# Patient Record
Sex: Male | Born: 1955 | Race: Black or African American | Hispanic: No | Marital: Married | Smoking: Never smoker
Health system: Southern US, Community
[De-identification: ages and names within clinical notes are randomized; demographics above are authoritative.]

## PROBLEM LIST (undated history)

## (undated) ENCOUNTER — Ambulatory Visit: Admission: EM | Payer: 59 | Source: Home / Self Care

## (undated) DIAGNOSIS — C801 Malignant (primary) neoplasm, unspecified: Secondary | ICD-10-CM

## (undated) DIAGNOSIS — I10 Essential (primary) hypertension: Secondary | ICD-10-CM

## (undated) DIAGNOSIS — M1990 Unspecified osteoarthritis, unspecified site: Secondary | ICD-10-CM

## (undated) DIAGNOSIS — C61 Malignant neoplasm of prostate: Secondary | ICD-10-CM

## (undated) DIAGNOSIS — N529 Male erectile dysfunction, unspecified: Secondary | ICD-10-CM

## (undated) DIAGNOSIS — M109 Gout, unspecified: Secondary | ICD-10-CM

## (undated) HISTORY — DX: Male erectile dysfunction, unspecified: N52.9

## (undated) HISTORY — DX: Unspecified osteoarthritis, unspecified site: M19.90

## (undated) HISTORY — DX: Essential (primary) hypertension: I10

---

## 1998-04-23 ENCOUNTER — Emergency Department (HOSPITAL_COMMUNITY): Admission: EM | Admit: 1998-04-23 | Discharge: 1998-04-23 | Payer: Self-pay | Admitting: Emergency Medicine

## 2003-08-01 ENCOUNTER — Emergency Department (HOSPITAL_COMMUNITY): Admission: AD | Admit: 2003-08-01 | Discharge: 2003-08-01 | Payer: Self-pay | Admitting: Family Medicine

## 2006-11-16 ENCOUNTER — Emergency Department (HOSPITAL_COMMUNITY): Admission: EM | Admit: 2006-11-16 | Discharge: 2006-11-16 | Payer: Self-pay | Admitting: Emergency Medicine

## 2011-08-07 ENCOUNTER — Ambulatory Visit (INDEPENDENT_AMBULATORY_CARE_PROVIDER_SITE_OTHER): Payer: 59 | Admitting: Internal Medicine

## 2011-08-07 DIAGNOSIS — Z09 Encounter for follow-up examination after completed treatment for conditions other than malignant neoplasm: Secondary | ICD-10-CM

## 2011-08-07 DIAGNOSIS — N529 Male erectile dysfunction, unspecified: Secondary | ICD-10-CM | POA: Insufficient documentation

## 2011-08-07 DIAGNOSIS — I10 Essential (primary) hypertension: Secondary | ICD-10-CM

## 2011-08-07 DIAGNOSIS — M109 Gout, unspecified: Secondary | ICD-10-CM | POA: Insufficient documentation

## 2011-08-07 DIAGNOSIS — Z7189 Other specified counseling: Secondary | ICD-10-CM

## 2011-08-07 LAB — POCT CBC
Granulocyte percent: 58.5 %G (ref 37–80)
HCT, POC: 44.3 % (ref 43.5–53.7)
Hemoglobin: 14.3 g/dL (ref 14.1–18.1)
Lymph, poc: 2.4 (ref 0.6–3.4)
MCH, POC: 27.8 pg (ref 27–31.2)
MCHC: 32.3 g/dL (ref 31.8–35.4)
MCV: 86 fL (ref 80–97)
MID (cbc): 0.4 (ref 0–0.9)
MPV: 10.1 fL (ref 0–99.8)
POC Granulocyte: 3.9 (ref 2–6.9)
POC LYMPH PERCENT: 35.1 %L (ref 10–50)
POC MID %: 6.4 %M (ref 0–12)
Platelet Count, POC: 239 10*3/uL (ref 142–424)
RBC: 5.15 M/uL (ref 4.69–6.13)
RDW, POC: 14.1 %
WBC: 6.7 10*3/uL (ref 4.6–10.2)

## 2011-08-07 LAB — POCT URINALYSIS DIPSTICK
Bilirubin, UA: NEGATIVE
Glucose, UA: NEGATIVE
Ketones, UA: NEGATIVE
Leukocytes, UA: NEGATIVE
Nitrite, UA: NEGATIVE
Protein, UA: 30
Spec Grav, UA: 1.015
Urobilinogen, UA: NEGATIVE
pH, UA: 5.5

## 2011-08-07 LAB — LIPID PANEL
Cholesterol: 191 mg/dL (ref 0–200)
HDL: 46 mg/dL (ref >39)
LDL Cholesterol: 132 mg/dL — ABNORMAL HIGH (ref 0–99)
Total CHOL/HDL Ratio: 4.2 Ratio
Triglycerides: 63 mg/dL (ref <150)
VLDL: 13 mg/dL (ref 0–40)

## 2011-08-07 LAB — COMPREHENSIVE METABOLIC PANEL
ALT: 21 U/L (ref 0–53)
AST: 30 U/L (ref 0–37)
Albumin: 4.6 g/dL (ref 3.5–5.2)
Alkaline Phosphatase: 47 U/L (ref 39–117)
BUN: 11 mg/dL (ref 6–23)
CO2: 26 mEq/L (ref 19–32)
Calcium: 9.5 mg/dL (ref 8.4–10.5)
Chloride: 105 mEq/L (ref 96–112)
Creat: 0.97 mg/dL (ref 0.50–1.35)
Glucose, Bld: 105 mg/dL — ABNORMAL HIGH (ref 70–99)
Potassium: 4.3 mEq/L (ref 3.5–5.3)
Sodium: 140 mEq/L (ref 135–145)
Total Bilirubin: 0.5 mg/dL (ref 0.3–1.2)
Total Protein: 7.2 g/dL (ref 6.0–8.3)

## 2011-08-07 LAB — POCT UA - MICROSCOPIC ONLY
Bacteria, U Microscopic: NEGATIVE
Casts, Ur, LPF, POC: NEGATIVE
Crystals, Ur, HPF, POC: NEGATIVE
Epithelial cells, urine per micros: NEGATIVE
Mucus, UA: NEGATIVE
WBC, Ur, HPF, POC: NEGATIVE
Yeast, UA: NEGATIVE

## 2011-08-07 LAB — URIC ACID: Uric Acid, Serum: 9.1 mg/dL — ABNORMAL HIGH (ref 4.0–7.8)

## 2011-08-07 LAB — TSH: TSH: 0.718 u[IU]/mL (ref 0.350–4.500)

## 2011-08-07 LAB — PSA: PSA: 2.01 ng/mL (ref <=4.00)

## 2011-08-07 LAB — POCT SEDIMENTATION RATE: POCT SED RATE: 12 mm/hr (ref 0–22)

## 2011-08-07 MED ORDER — AMLODIPINE BESY-BENAZEPRIL HCL 5-10 MG PO CAPS: 1.0000 | ORAL_CAPSULE | Freq: Every day | ORAL | Status: DC

## 2011-08-07 MED ORDER — COLCHICINE 0.6 MG PO TABS: 0.6000 mg | ORAL_TABLET | Freq: Two times a day (BID) | ORAL | Status: DC

## 2011-08-07 MED ORDER — TADALAFIL 20 MG PO TABS: 20.0000 mg | ORAL_TABLET | ORAL | Status: DC | PRN

## 2011-08-07 MED ORDER — ALLOPURINOL 300 MG PO TABS: 300.0000 mg | ORAL_TABLET | Freq: Every day | ORAL | Status: DC

## 2011-08-07 NOTE — Patient Instructions (Signed)
Gout Gout is an inflammatory condition (arthritis) caused by a buildup of uric acid crystals in the joints. Uric acid is a chemical that is normally present in the blood. Under some circumstances, uric acid can form into crystals in your joints. This causes joint redness, soreness, and swelling (inflammation). Repeat attacks are common. Over time, uric acid crystals can form into masses (tophi) near a joint, causing disfigurement. Gout is treatable and often preventable. CAUSES  The disease begins with elevated levels of uric acid in the blood. Uric acid is produced by your body when it breaks down a naturally found substance called purines. This also happens when you eat certain foods such as meats and fish. Causes of an elevated uric acid level include:  Being passed down from parent to child (heredity).   Diseases that cause increased uric acid production (obesity, psoriasis, some cancers).   Excessive alcohol use.   Diet, especially diets rich in meat and seafood.   Medicines, including certain cancer-fighting drugs (chemotherapy), diuretics, and aspirin.   Chronic kidney disease. The kidneys are no longer able to remove uric acid well.   Problems with metabolism.  Conditions strongly associated with gout include:  Obesity.   High blood pressure.   High cholesterol.   Diabetes.  Not everyone with elevated uric acid levels gets gout. It is not understood why some people get gout and others do not. Surgery, joint injury, and eating too much of certain foods are some of the factors that can lead to gout. SYMPTOMS   An attack of gout comes on quickly. It causes intense pain with redness, swelling, and warmth in a joint.   Fever can occur.   Often, only one joint is involved. Certain joints are more commonly involved:   Base of the big toe.   Knee.   Ankle.   Wrist.   Finger.  Without treatment, an attack usually goes away in a few days to weeks. Between attacks, you  usually will not have symptoms, which is different from many other forms of arthritis. DIAGNOSIS  Your caregiver will suspect gout based on your symptoms and exam. Removal of fluid from the joint (arthrocentesis) is done to check for uric acid crystals. Your caregiver will give you a medicine that numbs the area (local anesthetic) and use a needle to remove joint fluid for exam. Gout is confirmed when uric acid crystals are seen in joint fluid, using a special microscope. Sometimes, blood, urine, and X-ray tests are also used. TREATMENT  There are 2 phases to gout treatment: treating the sudden onset (acute) attack and preventing attacks (prophylaxis). Treatment of an Acute Attack  Medicines are used. These include anti-inflammatory medicines or steroid medicines.   An injection of steroid medicine into the affected joint is sometimes necessary.   The painful joint is rested. Movement can worsen the arthritis.   You may use warm or cold treatments on painful joints, depending which works best for you.   Discuss the use of coffee, vitamin C, or cherries with your caregiver. These may be helpful treatment options.  Treatment to Prevent Attacks After the acute attack subsides, your caregiver may advise prophylactic medicine. These medicines either help your kidneys eliminate uric acid from your body or decrease your uric acid production. You may need to stay on these medicines for a very long time. The early phase of treatment with prophylactic medicine can be associated with an increase in acute gout attacks. For this reason, during the first few months   of treatment, your caregiver may also advise you to take medicines usually used for acute gout treatment. Be sure you understand your caregiver's directions. You should also discuss dietary treatment with your caregiver. Certain foods such as meats and fish can increase uric acid levels. Other foods such as dairy can decrease levels. Your caregiver  can give you a list of foods to avoid. HOME CARE INSTRUCTIONS   Do not take aspirin to relieve pain. This raises uric acid levels.   Only take over-the-counter or prescription medicines for pain, discomfort, or fever as directed by your caregiver.   Rest the joint as much as possible. When in bed, keep sheets and blankets off painful areas.   Keep the affected joint raised (elevated).   Use crutches if the painful joint is in your leg.   Drink enough water and fluids to keep your urine clear or pale yellow. This helps your body get rid of uric acid. Do not drink alcoholic beverages. They slow the passage of uric acid.   Follow your caregiver's dietary instructions. Pay careful attention to the amount of protein you eat. Your daily diet should emphasize fruits, vegetables, whole grains, and fat-free or low-fat milk products.   Maintain a healthy body weight.  SEEK MEDICAL CARE IF:   You have an oral temperature above 102 F (38.9 C).   You develop diarrhea, vomiting, or any side effects from medicines.   You do not feel better in 24 hours, or you are getting worse.  SEEK IMMEDIATE MEDICAL CARE IF:   Your joint becomes suddenly more tender and you have:   Chills.   An oral temperature above 102 F (38.9 C), not controlled by medicine.  MAKE SURE YOU:   Understand these instructions.   Will watch your condition.   Will get help right away if you are not doing well or get worse.  Document Released: 05/07/2000 Document Revised: 04/29/2011 Document Reviewed: 08/18/2009 [employer] Patient Information 2012 [employer]. 

## 2011-08-07 NOTE — Progress Notes (Signed)
  Subjective:    Patient ID: Thomas Ayala, male    DOB: Nov 01, 1955, 56 y.o.   MRN: [id number]  HPI Has gout,HTN,ED Feels good. Needs meds refilled See complete hx scanned in.    Review of Systems See ROS scanned in.     Objective:   Physical Exam Normal        Assessment & Plan:  Refill meds Schedule CPE soon, agrees

## 2011-08-09 ENCOUNTER — Encounter: Payer: Self-pay | Admitting: *Deleted

## 2011-09-16 ENCOUNTER — Telehealth: Payer: Self-pay

## 2011-09-16 DIAGNOSIS — Z7189 Other specified counseling: Secondary | ICD-10-CM

## 2011-09-16 DIAGNOSIS — N529 Male erectile dysfunction, unspecified: Secondary | ICD-10-CM

## 2011-09-16 DIAGNOSIS — I10 Essential (primary) hypertension: Secondary | ICD-10-CM

## 2011-09-16 MED ORDER — COLCHICINE 0.6 MG PO TABS: 0.6000 mg | ORAL_TABLET | Freq: Two times a day (BID) | ORAL | Status: DC

## 2011-09-16 NOTE — Telephone Encounter (Signed)
PT STATES HE HAS GOUT AND WOULD LIKE A PRESCRIPTION - PHARMACY - [facility] ON [address]  STATES HE HAS NO MONEY FOR AN OV  PLEASE CALL

## 2011-09-16 NOTE — Telephone Encounter (Signed)
Left message on machine that rx was sent to pharm.

## 2011-09-16 NOTE — Telephone Encounter (Signed)
Can we give him something for gout?  He was seen on 08/07/11.

## 2012-01-19 ENCOUNTER — Ambulatory Visit: Payer: Self-pay | Admitting: Internal Medicine

## 2012-08-28 DIAGNOSIS — R809 Proteinuria, unspecified: Secondary | ICD-10-CM | POA: Insufficient documentation

## 2012-08-28 DIAGNOSIS — E291 Testicular hypofunction: Secondary | ICD-10-CM | POA: Insufficient documentation

## 2012-09-09 ENCOUNTER — Other Ambulatory Visit: Payer: Self-pay | Admitting: Internal Medicine

## 2014-06-20 DIAGNOSIS — Z8546 Personal history of malignant neoplasm of prostate: Secondary | ICD-10-CM | POA: Insufficient documentation

## 2014-06-20 DIAGNOSIS — C61 Malignant neoplasm of prostate: Secondary | ICD-10-CM | POA: Insufficient documentation

## 2015-12-04 DIAGNOSIS — N5201 Erectile dysfunction due to arterial insufficiency: Secondary | ICD-10-CM | POA: Insufficient documentation

## 2016-05-21 ENCOUNTER — Inpatient Hospital Stay (HOSPITAL_COMMUNITY)
Admission: EM | Admit: 2016-05-21 | Discharge: 2016-05-24 | DRG: 872 | Disposition: A | Discharge status: Home / Self Care | Payer: Commercial Managed Care - HMO | Attending: Internal Medicine | Admitting: Internal Medicine

## 2016-05-21 ENCOUNTER — Emergency Department (HOSPITAL_COMMUNITY): Payer: Commercial Managed Care - HMO

## 2016-05-21 ENCOUNTER — Encounter (HOSPITAL_COMMUNITY): Payer: Self-pay

## 2016-05-21 DIAGNOSIS — Z8546 Personal history of malignant neoplasm of prostate: Secondary | ICD-10-CM

## 2016-05-21 DIAGNOSIS — Z79899 Other long term (current) drug therapy: Secondary | ICD-10-CM

## 2016-05-21 DIAGNOSIS — I10 Essential (primary) hypertension: Secondary | ICD-10-CM | POA: Diagnosis present

## 2016-05-21 DIAGNOSIS — N12 Tubulo-interstitial nephritis, not specified as acute or chronic: Secondary | ICD-10-CM | POA: Diagnosis present

## 2016-05-21 DIAGNOSIS — N390 Urinary tract infection, site not specified: Secondary | ICD-10-CM | POA: Diagnosis not present

## 2016-05-21 DIAGNOSIS — M109 Gout, unspecified: Secondary | ICD-10-CM | POA: Diagnosis present

## 2016-05-21 DIAGNOSIS — A419 Sepsis, unspecified organism: Principal | ICD-10-CM | POA: Diagnosis present

## 2016-05-21 DIAGNOSIS — M545 Low back pain: Secondary | ICD-10-CM | POA: Diagnosis present

## 2016-05-21 DIAGNOSIS — Z87891 Personal history of nicotine dependence: Secondary | ICD-10-CM

## 2016-05-21 HISTORY — DX: Malignant neoplasm of prostate: C61

## 2016-05-21 LAB — COMPREHENSIVE METABOLIC PANEL
ALT: 24 U/L (ref 17–63)
AST: 23 U/L (ref 15–41)
Albumin: 3.2 g/dL — ABNORMAL LOW (ref 3.5–5.0)
Alkaline Phosphatase: 51 U/L (ref 38–126)
Anion gap: 7 (ref 5–15)
BUN: 9 mg/dL (ref 6–20)
CO2: 27 mmol/L (ref 22–32)
Calcium: 8.5 mg/dL — ABNORMAL LOW (ref 8.9–10.3)
Chloride: 106 mmol/L (ref 101–111)
Creatinine, Ser: 1.21 mg/dL (ref 0.61–1.24)
GFR calc Af Amer: >60 mL/min (ref >60)
GFR calc non Af Amer: >60 mL/min (ref >60)
Glucose, Bld: 116 mg/dL — ABNORMAL HIGH (ref 65–99)
Potassium: 3.6 mmol/L (ref 3.5–5.1)
Sodium: 140 mmol/L (ref 135–145)
Total Bilirubin: 0.6 mg/dL (ref 0.3–1.2)
Total Protein: 6.6 g/dL (ref 6.5–8.1)

## 2016-05-21 LAB — CBC WITH DIFFERENTIAL/PLATELET
Basophils Absolute: 0 10*3/uL (ref 0.0–0.1)
Basophils Relative: 0 %
Eosinophils Absolute: 0.1 10*3/uL (ref 0.0–0.7)
Eosinophils Relative: 1 %
HCT: 36.4 % — ABNORMAL LOW (ref 39.0–52.0)
Hemoglobin: 12.6 g/dL — ABNORMAL LOW (ref 13.0–17.0)
Lymphocytes Relative: 11 %
Lymphs Abs: 1.3 10*3/uL (ref 0.7–4.0)
MCH: 28.6 pg (ref 26.0–34.0)
MCHC: 34.6 g/dL (ref 30.0–36.0)
MCV: 82.7 fL (ref 78.0–100.0)
Monocytes Absolute: 0.6 10*3/uL (ref 0.1–1.0)
Monocytes Relative: 5 %
Neutro Abs: 10.4 10*3/uL — ABNORMAL HIGH (ref 1.7–7.7)
Neutrophils Relative %: 83 %
Platelets: 194 10*3/uL (ref 150–400)
RBC: 4.4 MIL/uL (ref 4.22–5.81)
RDW: 13.1 % (ref 11.5–15.5)
WBC: 12.5 10*3/uL — ABNORMAL HIGH (ref 4.0–10.5)

## 2016-05-21 LAB — URINALYSIS, MICROSCOPIC (REFLEX)

## 2016-05-21 LAB — URINALYSIS, ROUTINE W REFLEX MICROSCOPIC
Bilirubin Urine: NEGATIVE
Glucose, UA: 100 mg/dL — AB
Ketones, ur: NEGATIVE mg/dL
Nitrite: POSITIVE — AB
Protein, ur: >300 mg/dL — AB
Specific Gravity, Urine: 1.02 (ref 1.005–1.030)
pH: 6 (ref 5.0–8.0)

## 2016-05-21 LAB — I-STAT CG4 LACTIC ACID, ED: Lactic Acid, Venous: 2.63 mmol/L (ref 0.5–1.9)

## 2016-05-21 MED ORDER — SODIUM CHLORIDE 0.9 % IV BOLUS (SEPSIS)
1000.0000 mL | Freq: Once | INTRAVENOUS | Status: AC
  Administered 2016-05-21: 1000 mL via INTRAVENOUS

## 2016-05-21 MED ORDER — CEFTRIAXONE SODIUM 1 G IJ SOLR
1.0000 g | Freq: Once | INTRAMUSCULAR | Status: AC
  Administered 2016-05-21: 1 g via INTRAVENOUS
  Filled 2016-05-21: qty 10

## 2016-05-21 MED ORDER — SODIUM CHLORIDE 0.9 % IV BOLUS (SEPSIS)
1000.0000 mL | Freq: Once | INTRAVENOUS | Status: AC
  Administered 2016-05-22: 1000 mL via INTRAVENOUS

## 2016-05-21 MED ORDER — SODIUM CHLORIDE 0.9 % IV SOLN
INTRAVENOUS | Status: DC
  Administered 2016-05-22 – 2016-05-24 (×4): via INTRAVENOUS

## 2016-05-21 NOTE — H&P (Signed)
History and Physical    Thomas Ayala DOB: 04/03/1956 DOA: 05/21/2016  Referring MD/NP/PA:   PCP: Kennon Portela, MD   Patient coming from:  The patient is coming from home.  At baseline, pt is independent for most of ADL.  Chief Complaint: Fever, chills, dysuria  HPI: Thomas Ayala is a 60 y.o. male with medical history significant of hyperlipidemia, gout, erectile dysfunction, arthritis, prostate cancer (under watching, no treatment), who presents with fever, chills, dysuria.  Patient states that she has been having fever, chills in the past 3 days. She also has dysuria, burning on urination and increased urinary frequency. She has mild cough, no chest pain or shortness of breath. Patient had lower abdominal pain earlier, which has resolved. No nausea, vomiting, diarrhea. She complains sulfa lower back pain, but no flank pain. No unilateral weakness.  ED Course: pt was found to have positive urinalysis with small amount of leukocytes and positive nitrite, WBC 12.5, lactate 2.63, creatinine 1.21, negative chest x-ray, temperature 101.5, tachycardia, tachypnea, oxygen saturation 95% on room air. bp is soft. Pt is placed on tele bed for obs.  Review of Systems:   General: has fevers, chills, no changes in body weight, has poor appetite, has fatigue HEENT: no blurry vision, hearing changes or sore throat Respiratory: no dyspnea, has coughing, no wheezing CV: no chest pain, no palpitations GI: no nausea, vomiting, abdominal pain, diarrhea, constipation GU: has dysuria, burning on urination, increased urinary frequency, no hematuria  Ext: no leg edema Neuro: no unilateral weakness, numbness, or tingling, no vision change or hearing loss Skin: no rash, no skin tear. MSK: has back pain Heme: No easy bruising.  Travel history: No recent long distant travel.  Allergy: No Known Allergies  Past Medical History:  Diagnosis Date  . Arthritis   . ED (erectile  dysfunction)   . Gout   . Hypertension   . Prostate cancer [facility])     History reviewed. No pertinent surgical history.  Social History:  reports that he quit smoking about 24 years ago. He has never used smokeless tobacco. He reports that he drinks alcohol. His drug history is not on file.  Family History:  Family History  Problem Relation Age of Onset  . Dementia Mother   . Lung cancer Father   . Prostate cancer Brother      Prior to Admission medications   Medication Sig Start Date End Date Taking? Authorizing Provider  amLODipine-benazepril (LOTREL) 10-20 MG capsule Take 1 capsule by mouth daily. 05/14/16  Yes Historical Provider, MD  allopurinol (ZYLOPRIM) 300 MG tablet Take 1 tablet (300 mg total) by mouth daily. Patient not taking: Reported on 05/21/2016 08/07/11   Orma Flaming, MD  amLODipine-benazepril (LOTREL) 5-10 MG per capsule Take 1 capsule by mouth daily. Patient not taking: Reported on 05/21/2016 08/07/11   Orma Flaming, MD  colchicine 0.6 MG tablet Take 1 tablet (0.6 mg total) by mouth 2 (two) times daily. Patient not taking: Reported on 05/21/2016 09/16/11   Argentina Donovan, PA-C    Physical Exam: Vitals:   05/22/16 0200 05/22/16 0230 05/22/16 0300 05/22/16 0313  BP: 107/66 99/74 101/67   Pulse: 91 87 84   Resp: 19 26 21    Temp:    99.8 F (37.7 C)  TempSrc:      SpO2: 96% 96% 96%   Weight:      Height:       General: Not in acute distress HEENT:  Eyes: PERRL, EOMI, no scleral icterus.       ENT: No discharge from the ears and nose, no pharynx injection, no tonsillar enlargement.        Neck: No JVD, no bruit, no mass felt. Heme: No neck lymph node enlargement. Cardiac: S1/S2, RRR, No murmurs, No gallops or rubs. Respiratory: No rales, wheezing, rhonchi or rubs. GI: Soft, nondistended, nontender, no rebound pain, no organomegaly, BS present. GU: No hematuria Ext: No pitting leg edema bilaterally. 2+DP/PT pulse bilaterally. Musculoskeletal: No  joint deformities, No joint redness or warmth, no limitation of ROM in spin. Skin: No rashes.  Neuro: Alert, oriented X3, cranial nerves II-XII grossly intact, moves all extremities normally.  Psych: Patient is not psychotic, no suicidal or hemocidal ideation.  Labs on Admission: I have personally reviewed following labs and imaging studies  CBC:  Recent Labs Lab 05/21/16 2136  WBC 12.5*  NEUTROABS 10.4*  HGB 12.6*  HCT 36.4*  MCV 82.7  PLT [id number]   Basic Metabolic Panel:  Recent Labs Lab 05/21/16 2136  NA 140  K 3.6  CL 106  CO2 27  GLUCOSE 116*  BUN 9  CREATININE 1.21  CALCIUM 8.5*   GFR: Estimated Creatinine Clearance: 73.7 mL/min (by C-G formula based on SCr of 1.21 mg/dL). Liver Function Tests:  Recent Labs Lab 05/21/16 2136  AST 23  ALT 24  ALKPHOS 51  BILITOT 0.6  PROT 6.6  ALBUMIN 3.2*   No results for input(s): LIPASE, AMYLASE in the last 168 hours. No results for input(s): AMMONIA in the last 168 hours. Coagulation Profile: No results for input(s): INR, PROTIME in the last 168 hours. Cardiac Enzymes: No results for input(s): CKTOTAL, CKMB, CKMBINDEX, TROPONINI in the last 168 hours. BNP (last 3 results) No results for input(s): PROBNP in the last 8760 hours. HbA1C: No results for input(s): HGBA1C in the last 72 hours. CBG: No results for input(s): GLUCAP in the last 168 hours. Lipid Profile: No results for input(s): CHOL, HDL, LDLCALC, TRIG, CHOLHDL, LDLDIRECT in the last 72 hours. Thyroid Function Tests: No results for input(s): TSH, T4TOTAL, FREET4, T3FREE, THYROIDAB in the last 72 hours. Anemia Panel: No results for input(s): VITAMINB12, FOLATE, FERRITIN, TIBC, IRON, RETICCTPCT in the last 72 hours. Urine analysis:    Component Value Date/Time   COLORURINE YELLOW 05/21/2016 2130   APPEARANCEUR CLOUDY (A) 05/21/2016 2130   LABSPEC 1.020 05/21/2016 2130   PHURINE 6.0 05/21/2016 2130   GLUCOSEU 100 (A) 05/21/2016 2130   HGBUR LARGE (A)  05/21/2016 2130   BILIRUBINUR NEGATIVE 05/21/2016 2130   BILIRUBINUR neg 08/07/2011 0958   KETONESUR NEGATIVE 05/21/2016 2130   PROTEINUR >300 (A) 05/21/2016 2130   UROBILINOGEN negative 08/07/2011 0958   NITRITE POSITIVE (A) 05/21/2016 2130   LEUKOCYTESUR SMALL (A) 05/21/2016 2130   Sepsis Labs: @LABRCNTIP (procalcitonin:4,lacticidven:4) )No results found for this or any previous visit (from the past 240 hour(s)).   Radiological Exams on Admission: Dg Chest 2 View  Result Date: 05/21/2016 CLINICAL DATA:  Cough and fever. EXAM: CHEST  2 VIEW COMPARISON:  None. FINDINGS: The heart size and mediastinal contours are within normal limits. Both lungs are clear. The visualized skeletal structures are unremarkable. IMPRESSION: No active cardiopulmonary disease. Electronically Signed   By: Nolon Nations M.D.   On: 05/21/2016 22:15     EKG: Independently reviewed.  Sinus rhythm, QTC 538, tachycardia, LAD, anteroseptal infarction pattern.   Assessment/Plan Principal Problem:   Lower urinary tract infectious disease Active Problems:   Essential  hypertension   Sepsis ([place])   UTI and sepsis: pt is septic with elevated lactate, leukocytosis, fever, tachycardia and tachypnea and soft blood pressure. Currently hemodynamically stable.   - Place on temetry bed for obs -  Ceftriaxone by IV - Follow up results of urine and blood cx and amend antibiotic regimen if needed per sensitivity results - prn hydroxyzinefor nausea - will get Procalcitonin and trend lactic acid levels per sepsis protocol. - IVF: 3L of NS bolus in ED, followed by 150 cc/h  - check HIV ab, GC/chalamydia  Essential hypertension: -hold Bp meds due to soft bp   DVT ppx: sQ Lovenox Code Status: Full code Family Communication: Yes, patient's wife at bed side Disposition Plan:  Anticipate discharge back to previous home environment Consults called: None Admission status: Obs / tele   Date of Service 05/22/2016     Ivor Costa [facility] Pager [phone]  If 7PM-7AM, please contact night-coverage [email or web address] Password [email or web address] 05/22/2016, 5:18 AM

## 2016-05-21 NOTE — ED Provider Notes (Signed)
,  [facility] DEPT Provider Note   CSN: [id number] Arrival date & time: 05/21/16  2112     History   Chief Complaint Chief Complaint  Patient presents with  . Fever    HPI Thomas Ayala is a 60 y.o. male.  HPI Patient brought in by EMS for chills and feeling bad. His felt bad for couple days. Fever worse today. Initially EMS blood pressure was 90 systolic with a fast respiratory rate. Heart rates were 120. This had some dysuria. Has had a cough without production. Slight headache. No nausea vomiting.   Past Medical History:  Diagnosis Date  . Arthritis   . ED (erectile dysfunction)   . Gout   . Hypertension     Patient Active Problem List   Diagnosis Date Noted  . Gout 08/07/2011  . HTN (hypertension) 08/07/2011  . ED (erectile dysfunction) 08/07/2011    History reviewed. No pertinent surgical history.     Home Medications    Prior to Admission medications   Medication Sig Start Date End Date Taking? Authorizing Provider  amLODipine-benazepril (LOTREL) 10-20 MG capsule Take 1 capsule by mouth daily. 05/14/16  Yes Historical Provider, MD  allopurinol (ZYLOPRIM) 300 MG tablet Take 1 tablet (300 mg total) by mouth daily. Patient not taking: Reported on 05/21/2016 08/07/11   Orma Flaming, MD  amLODipine-benazepril (LOTREL) 5-10 MG per capsule Take 1 capsule by mouth daily. Patient not taking: Reported on 05/21/2016 08/07/11   Orma Flaming, MD  colchicine 0.6 MG tablet Take 1 tablet (0.6 mg total) by mouth 2 (two) times daily. Patient not taking: Reported on 05/21/2016 09/16/11   Argentina Donovan, PA-C    Family History No family history on file.  Social History Social History  Substance Use Topics  . Smoking status: Former Smoker    Quit date: 08/07/1991  . Smokeless tobacco: Never Used  . Alcohol use Yes     Allergies   Patient has no known allergies.   Review of Systems Review of Systems  Constitutional: Positive for chills and fever.  Negative for appetite change.  HENT: Negative for congestion.   Respiratory: Negative for shortness of breath.   Cardiovascular: Negative for chest pain.  Gastrointestinal: Negative for abdominal pain.  Genitourinary: Positive for dysuria. Negative for flank pain.  Musculoskeletal: Negative for back pain.  Neurological: Negative for headaches.  Hematological: Negative for adenopathy.     Physical Exam Updated Vital Signs BP 116/72   Pulse 101   Temp 101.5 F (38.6 C) (Oral)   Resp 22   Ht 5' 7.5" (1.715 m)   Wt 220 lb (99.8 kg)   SpO2 96%   BMI 33.95 kg/m   Physical Exam  Constitutional: He appears well-developed.  HENT:  Head: Normocephalic.  Eyes: EOM are normal.  Neck: Neck supple.  Cardiovascular:  Tachycardia  Pulmonary/Chest: Effort normal. No respiratory distress.  Abdominal: There is no tenderness.  Musculoskeletal: He exhibits no edema.  Neurological: He is alert.  Skin: Skin is warm. Capillary refill takes less than 2 seconds.  Psychiatric: He has a normal mood and affect.  no perineal tenderness.   ED Treatments / Results  Labs (all labs ordered are listed, but only abnormal results are displayed) Labs Reviewed  COMPREHENSIVE METABOLIC PANEL - Abnormal; Notable for the following:       Result Value   Glucose, Bld 116 (*)    Calcium 8.5 (*)    Albumin 3.2 (*)    All other  components within normal limits  CBC WITH DIFFERENTIAL/PLATELET - Abnormal; Notable for the following:    WBC 12.5 (*)    Hemoglobin 12.6 (*)    HCT 36.4 (*)    Neutro Abs 10.4 (*)    All other components within normal limits  URINALYSIS, ROUTINE W REFLEX MICROSCOPIC - Abnormal; Notable for the following:    APPearance CLOUDY (*)    Glucose, UA 100 (*)    Hgb urine dipstick LARGE (*)    Protein, ur >300 (*)    Nitrite POSITIVE (*)    Leukocytes, UA SMALL (*)    All other components within normal limits  URINALYSIS, MICROSCOPIC (REFLEX) - Abnormal; Notable for the following:     Bacteria, UA MANY (*)    Squamous Epithelial / LPF 0-5 (*)    All other components within normal limits  I-STAT CG4 LACTIC ACID, ED - Abnormal; Notable for the following:    Lactic Acid, Venous 2.63 (*)    All other components within normal limits  CULTURE, BLOOD (ROUTINE X 2)  CULTURE, BLOOD (ROUTINE X 2)  URINE CULTURE  LACTIC ACID, PLASMA    EKG  EKG Interpretation  Date/Time:  Friday May 21 2016 21:20:15 EST Ventricular Rate:  129 PR Interval:    QRS Duration: 78 QT Interval:  367 QTC Calculation: 538 R Axis:   26 Text Interpretation:  Sinus tachycardia Ventricular premature complex Borderline T abnormalities, inferior leads Prolonged QT interval Confirmed by Alvino Chapel  MD, Jaycen Vercher [phone]) on 05/21/2016 11:20:06 PM       Radiology Dg Chest 2 View  Result Date: 05/21/2016 CLINICAL DATA:  Cough and fever. EXAM: CHEST  2 VIEW COMPARISON:  None. FINDINGS: The heart size and mediastinal contours are within normal limits. Both lungs are clear. The visualized skeletal structures are unremarkable. IMPRESSION: No active cardiopulmonary disease. Electronically Signed   By: Nolon Nations M.D.   On: 05/21/2016 22:15    Procedures Procedures (including critical care time)  Medications Ordered in ED Medications  sodium chloride 0.9 % bolus 1,000 mL (not administered)  sodium chloride 0.9 % bolus 1,000 mL (not administered)  sodium chloride 0.9 % bolus 1,000 mL (0 mLs Intravenous Stopped 05/21/16 2317)  cefTRIAXone (ROCEPHIN) 1 g in dextrose 5 % 50 mL IVPB (0 g Intravenous Stopped 05/21/16 2251)     Initial Impression / Assessment and Plan / ED Course  I have reviewed the triage vital signs and the nursing notes.  Pertinent labs & imaging results that were available during my care of the patient were reviewed by me and considered in my medical decision making (see chart for details).  Clinical Course     Patient with initial hypotension for EMS but has not been  hypotensive here initially. Febrile. Has apparent urinary tract infection. Has had prostate date problems in the past but no perineal tenderness. Patient's heart rate has come down nicely down to around 100 but he did have blood pressure that went to the 0000000 systolic and then was repeated to [id number] systolic. He had been having blood pressure systolic in the 1 teens. With this new hypotension I think it is worth observation and more IV fluids. Will admit to internal medicine.  Final Clinical Impressions(s) / ED Diagnoses   Final diagnoses:  Lower urinary tract infectious disease    New Prescriptions New Prescriptions   No medications on file     Davonna Belling, MD 05/21/16 2345

## 2016-05-21 NOTE — ED Triage Notes (Signed)
PER EMS: pt from home with c/o chills for "a couple of days" and tonight "i just couldn't warm up." He also reports fever today as well. EMS reported BP 90 systolic, [id number] breaths per minute and HR 120s. Pt A&Ox4.

## 2016-05-21 NOTE — ED Notes (Signed)
pt transported to xray at this time

## 2016-05-22 ENCOUNTER — Encounter (HOSPITAL_COMMUNITY): Payer: Self-pay | Admitting: Internal Medicine

## 2016-05-22 DIAGNOSIS — N390 Urinary tract infection, site not specified: Secondary | ICD-10-CM | POA: Diagnosis not present

## 2016-05-22 LAB — BASIC METABOLIC PANEL
Anion gap: 7 (ref 5–15)
BUN: 8 mg/dL (ref 6–20)
CO2: 24 mmol/L (ref 22–32)
Calcium: 8 mg/dL — ABNORMAL LOW (ref 8.9–10.3)
Chloride: 110 mmol/L (ref 101–111)
Creatinine, Ser: 1.03 mg/dL (ref 0.61–1.24)
GFR calc Af Amer: >60 mL/min (ref >60)
GFR calc non Af Amer: >60 mL/min (ref >60)
Glucose, Bld: 98 mg/dL (ref 65–99)
Potassium: 3.9 mmol/L (ref 3.5–5.1)
Sodium: 141 mmol/L (ref 135–145)

## 2016-05-22 LAB — LACTIC ACID, PLASMA
Lactic Acid, Venous: 0.9 mmol/L (ref 0.5–1.9)
Lactic Acid, Venous: 0.7 mmol/L (ref 0.5–1.9)

## 2016-05-22 LAB — BLOOD CULTURE ID PANEL (REFLEXED)

## 2016-05-22 LAB — CBC
HCT: 34.3 % — ABNORMAL LOW (ref 39.0–52.0)
Hemoglobin: 11.5 g/dL — ABNORMAL LOW (ref 13.0–17.0)
MCH: 28.4 pg (ref 26.0–34.0)
MCHC: 33.5 g/dL (ref 30.0–36.0)
MCV: 84.7 fL (ref 78.0–100.0)
Platelets: 179 10*3/uL (ref 150–400)
RBC: 4.05 MIL/uL — ABNORMAL LOW (ref 4.22–5.81)
RDW: 13.7 % (ref 11.5–15.5)
WBC: 13.4 10*3/uL — ABNORMAL HIGH (ref 4.0–10.5)

## 2016-05-22 LAB — PROCALCITONIN: Procalcitonin: 36.32 ng/mL

## 2016-05-22 MED ORDER — DEXTROSE 5 % IV SOLN
1.0000 g | INTRAVENOUS | Status: DC
  Administered 2016-05-22 – 2016-05-23 (×2): 1 g via INTRAVENOUS
  Filled 2016-05-22 (×2): qty 10

## 2016-05-22 MED ORDER — ACETAMINOPHEN 325 MG PO TABS
650.0000 mg | ORAL_TABLET | Freq: Four times a day (QID) | ORAL | Status: DC | PRN
  Administered 2016-05-22: 650 mg via ORAL
  Filled 2016-05-22: qty 2

## 2016-05-22 MED ORDER — ACETAMINOPHEN 650 MG RE SUPP: 650.0000 mg | Freq: Four times a day (QID) | RECTAL | Status: DC | PRN

## 2016-05-22 MED ORDER — DM-GUAIFENESIN ER 30-600 MG PO TB12: 1.0000 | ORAL_TABLET | Freq: Two times a day (BID) | ORAL | Status: DC | PRN

## 2016-05-22 MED ORDER — HYDROXYZINE HCL 10 MG PO TABS
10.0000 mg | ORAL_TABLET | Freq: Three times a day (TID) | ORAL | Status: DC | PRN
  Filled 2016-05-22: qty 1

## 2016-05-22 MED ORDER — ENOXAPARIN SODIUM 40 MG/0.4ML ~~LOC~~ SOLN
40.0000 mg | SUBCUTANEOUS | Status: DC
  Administered 2016-05-22 – 2016-05-23 (×2): 40 mg via SUBCUTANEOUS
  Filled 2016-05-22 (×2): qty 0.4

## 2016-05-22 MED ORDER — SODIUM CHLORIDE 0.9% FLUSH
3.0000 mL | Freq: Two times a day (BID) | INTRAVENOUS | Status: DC
  Administered 2016-05-23: 3 mL via INTRAVENOUS

## 2016-05-22 MED ORDER — ZOLPIDEM TARTRATE 5 MG PO TABS: 5.0000 mg | ORAL_TABLET | Freq: Every evening | ORAL | Status: DC | PRN

## 2016-05-22 NOTE — ED Notes (Signed)
Hospitalist in for pt eval

## 2016-05-22 NOTE — Progress Notes (Signed)
PROGRESS NOTE  Thomas Ayala [id number] DOB: 07-08-1955 DOA: 05/21/2016 PCP: Kennon Portela, MD   LOS: 0 days   Brief Narrative: 60 y.o. male with medical history significant of hyperlipidemia, gout, erectile dysfunction, arthritis, prostate cancer (under watching, no treatment), who presents with fever, chills, dysuria.  Assessment & Plan: Principal Problem:   Lower urinary tract infectious disease Active Problems:   Essential hypertension   Sepsis ([place])  Sepsis due to urinary tract infection and pyelonephritis  - Also complaining of back pain  - Cultures negative so far, responded really well to ceftriaxone and he is back to normal and wants to go home - He needs to be monitored outpatient setting for another 24 hours, this was at length explained to the patient given the fact that he was septic on presentation needs at least for 24 hours to be monitored in the inpatient setting   DVT prophylaxis: Lovenox Code Status: Full Family Communication: no family bedside Disposition Plan: home tomorrow  Consultants:   None   Procedures:   None   Antimicrobials:  Ceftriaxone 12/29 >>  Subjective: - no chest pain, shortness of breath, no abdominal pain, nausea or vomiting. Wants to go home  Objective: Vitals:   05/22/16 0757 05/22/16 0816 05/22/16 0958 05/22/16 1359  BP: 112/82 120/76 110/81 128/75  Pulse: 84 84 77 85  Resp: 16 16 18 18   Temp:  98.8 F (37.1 C) 98.7 F (37.1 C) 99.3 F (37.4 C)  TempSrc:  Oral Oral Oral  SpO2: 99% 100% 100% 98%  Weight:      Height:        Intake/Output Summary (Last 24 hours) at 05/22/16 1452 Last data filed at 05/22/16 0759  Gross per 24 hour  Intake             4370 ml  Output             1550 ml  Net             2820 ml   Filed Weights   05/21/16 2129  Weight: 99.8 kg (220 lb)    Examination: Constitutional: NAD Vitals:   05/22/16 0757 05/22/16 0816 05/22/16 0958 05/22/16 1359  BP: 112/82 120/76  110/81 128/75  Pulse: 84 84 77 85  Resp: 16 16 18 18   Temp:  98.8 F (37.1 C) 98.7 F (37.1 C) 99.3 F (37.4 C)  TempSrc:  Oral Oral Oral  SpO2: 99% 100% 100% 98%  Weight:      Height:       Respiratory: clear to auscultation bilaterally, no wheezing, no crackles.  Cardiovascular: Regular rate and rhythm, no murmurs / rubs / gallops. Abdomen: no tenderness. Bowel sounds positive.  Musculoskeletal: no CVA tenderness    Data Reviewed: I have personally reviewed following labs and imaging studies  CBC:  Recent Labs Lab 05/21/16 2136 05/22/16 0508  WBC 12.5* 13.4*  NEUTROABS 10.4*  --   HGB 12.6* 11.5*  HCT 36.4* 34.3*  MCV 82.7 84.7  PLT 194 0000000   Basic Metabolic Panel:  Recent Labs Lab 05/21/16 2136 05/22/16 0508  NA 140 141  K 3.6 3.9  CL 106 110  CO2 27 24  GLUCOSE 116* 98  BUN 9 8  CREATININE 1.21 1.03  CALCIUM 8.5* 8.0*   GFR: Estimated Creatinine Clearance: 86.6 mL/min (by C-G formula based on SCr of 1.03 mg/dL). Liver Function Tests:  Recent Labs Lab 05/21/16 2136  AST 23  ALT 24  ALKPHOS 51  BILITOT 0.6  PROT 6.6  ALBUMIN 3.2*   No results for input(s): LIPASE, AMYLASE in the last 168 hours. No results for input(s): AMMONIA in the last 168 hours. Coagulation Profile: No results for input(s): INR, PROTIME in the last 168 hours. Cardiac Enzymes: No results for input(s): CKTOTAL, CKMB, CKMBINDEX, TROPONINI in the last 168 hours. BNP (last 3 results) No results for input(s): PROBNP in the last 8760 hours. HbA1C: No results for input(s): HGBA1C in the last 72 hours. CBG: No results for input(s): GLUCAP in the last 168 hours. Lipid Profile: No results for input(s): CHOL, HDL, LDLCALC, TRIG, CHOLHDL, LDLDIRECT in the last 72 hours. Thyroid Function Tests: No results for input(s): TSH, T4TOTAL, FREET4, T3FREE, THYROIDAB in the last 72 hours. Anemia Panel: No results for input(s): VITAMINB12, FOLATE, FERRITIN, TIBC, IRON, RETICCTPCT in the  last 72 hours. Urine analysis:    Component Value Date/Time   COLORURINE YELLOW 05/21/2016 2130   APPEARANCEUR CLOUDY (A) 05/21/2016 2130   LABSPEC 1.020 05/21/2016 2130   PHURINE 6.0 05/21/2016 2130   GLUCOSEU 100 (A) 05/21/2016 2130   HGBUR LARGE (A) 05/21/2016 2130   BILIRUBINUR NEGATIVE 05/21/2016 2130   BILIRUBINUR neg 08/07/2011 0958   KETONESUR NEGATIVE 05/21/2016 2130   PROTEINUR >300 (A) 05/21/2016 2130   UROBILINOGEN negative 08/07/2011 0958   NITRITE POSITIVE (A) 05/21/2016 2130   LEUKOCYTESUR SMALL (A) 05/21/2016 2130   Sepsis Labs: Invalid input(s): PROCALCITONIN, LACTICIDVEN  Recent Results (from the past 240 hour(s))  Culture, blood (routine x 2)     Status: None (Preliminary result)   Collection Time: 05/21/16  9:36 PM  Result Value Ref Range Status   Specimen Description BLOOD RIGHT ANTECUBITAL  Final   Special Requests BOTTLES DRAWN AEROBIC AND ANAEROBIC 5CC  Final   Culture NO GROWTH < 12 HOURS  Final   Report Status PENDING  Incomplete  Culture, blood (routine x 2)     Status: None (Preliminary result)   Collection Time: 05/21/16  9:43 PM  Result Value Ref Range Status   Specimen Description BLOOD LEFT HAND  Final   Special Requests BOTTLES DRAWN AEROBIC AND ANAEROBIC 5CC  Final   Culture NO GROWTH < 12 HOURS  Final   Report Status PENDING  Incomplete      Radiology Studies: Dg Chest 2 View  Result Date: 05/21/2016 CLINICAL DATA:  Cough and fever. EXAM: CHEST  2 VIEW COMPARISON:  None. FINDINGS: The heart size and mediastinal contours are within normal limits. Both lungs are clear. The visualized skeletal structures are unremarkable. IMPRESSION: No active cardiopulmonary disease. Electronically Signed   By: Nolon Nations M.D.   On: 05/21/2016 22:15     Scheduled Meds: . cefTRIAXone (ROCEPHIN)  IV  1 g Intravenous Q24H  . enoxaparin (LOVENOX) injection  40 mg Subcutaneous Q24H  . sodium chloride flush  3 mL Intravenous Q12H   Continuous  Infusions: . sodium chloride 150 mL/hr at 05/22/16 0310    Marzetta Board, MD, PhD [facility] Pager [phone]  If 7PM-7AM, please contact night-coverage [email or web address] Password [email or web address] 05/22/2016, 2:52 PM

## 2016-05-22 NOTE — Progress Notes (Signed)
Pt called RN to inform of blood in urine. Urine is red and pt is c/o of some penile discomfort. On call Dr. Elton Sin paged and notified. Awaiting further orders. Will continue to monitor pt.

## 2016-05-22 NOTE — ED Notes (Signed)
[place] given

## 2016-05-22 NOTE — Progress Notes (Signed)
PHARMACY - PHYSICIAN COMMUNICATION CRITICAL VALUE ALERT - BLOOD CULTURE IDENTIFICATION (BCID)  Results for orders placed or performed during the hospital encounter of 05/21/16  Culture, blood (routine x 2)     Status: None (Preliminary result)   Collection Time: 05/21/16  9:36 PM  Result Value Ref Range Status   Specimen Description BLOOD RIGHT ANTECUBITAL  Final   Special Requests BOTTLES DRAWN AEROBIC AND ANAEROBIC 5CC  Final   Culture NO GROWTH < 24 HOURS  Final   Report Status PENDING  Incomplete  Culture, blood (routine x 2)     Status: None (Preliminary result)   Collection Time: 05/21/16  9:43 PM  Result Value Ref Range Status   Specimen Description BLOOD LEFT HAND  Final   Special Requests BOTTLES DRAWN AEROBIC AND ANAEROBIC 5CC  Final   Culture  Setup Time   Final    GRAM POSITIVE COCCI IN CLUSTERS ANAEROBIC BOTTLE ONLY CRITICAL RESULT CALLED TO, READ BACK BY AND VERIFIED WITH: C Jakeb Lamping,PHARMD AT 2017 05/22/16 BY L BENFIELD    Culture GRAM POSITIVE COCCI  Final   Report Status PENDING  Incomplete  Blood Culture ID Panel (Reflexed)     Status: Abnormal   Collection Time: 05/21/16  9:43 PM  Result Value Ref Range Status   Enterococcus species NOT DETECTED NOT DETECTED Final   Listeria monocytogenes NOT DETECTED NOT DETECTED Final   Staphylococcus species DETECTED (A) NOT DETECTED Final    Comment: CRITICAL RESULT CALLED TO, READ BACK BY AND VERIFIED WITH: C Azazel Franze,PHARMD AT 2017 05/22/16 BY L BENFIELD    Staphylococcus aureus NOT DETECTED NOT DETECTED Final   Methicillin resistance NOT DETECTED NOT DETECTED Final   Streptococcus species NOT DETECTED NOT DETECTED Final   Streptococcus agalactiae NOT DETECTED NOT DETECTED Final   Streptococcus pneumoniae NOT DETECTED NOT DETECTED Final   Streptococcus pyogenes NOT DETECTED NOT DETECTED Final   Acinetobacter baumannii NOT DETECTED NOT DETECTED Final   Enterobacteriaceae species NOT DETECTED NOT DETECTED Final   Enterobacter  cloacae complex NOT DETECTED NOT DETECTED Final   Escherichia coli NOT DETECTED NOT DETECTED Final   Klebsiella oxytoca NOT DETECTED NOT DETECTED Final   Klebsiella pneumoniae NOT DETECTED NOT DETECTED Final   Proteus species NOT DETECTED NOT DETECTED Final   Serratia marcescens NOT DETECTED NOT DETECTED Final   Haemophilus influenzae NOT DETECTED NOT DETECTED Final   Neisseria meningitidis NOT DETECTED NOT DETECTED Final   Pseudomonas aeruginosa NOT DETECTED NOT DETECTED Final   Candida albicans NOT DETECTED NOT DETECTED Final   Candida glabrata NOT DETECTED NOT DETECTED Final   Candida krusei NOT DETECTED NOT DETECTED Final   Candida parapsilosis NOT DETECTED NOT DETECTED Final   Candida tropicalis NOT DETECTED NOT DETECTED Final    Name of physician (or Provider) Contacted: [place]  Changes to prescribed antibiotics required: on ceftriaxone for UTI so will continue current regimen  Andrey Cota. Diona Foley, PharmD, [place] Clinical Pharmacist Pager [phone]

## 2016-05-22 NOTE — ED Notes (Addendum)
Found pt sitting in stretcher eating breakfast. Skin w/d, resp e/u. Pt smiling and states he feels much better. Denies wanting to be admitted. Explained process and that admitting MD would be around to do rounds shortly. Pt denies pain. Moved pt to hospital bed for increased comfort.

## 2016-05-22 NOTE — Progress Notes (Signed)
MICHAELA JOURDAN [id number] Admission Data: 05/22/2016 9:37 AM Attending Provider: Caren Griffins, MD  [id number], Veneda Melter, MD Consults/ Treatment Team:   BRICK SODERBERG is a 60 y.o. male patient admitted from ED awake, alert  & orientated  X 3,  Full Code, VSS - Blood pressure 120/76, pulse 84, temperature 98.8 F (37.1 C), temperature source Oral, resp. rate 16, height 5' 7.5" (1.715 m), weight 99.8 kg (220 lb), SpO2 100 %., O2    , no c/o shortness of breath, no c/o chest pain, no distress noted. Tele   IV site WDL:  hand left, condition patent and no redness and antecubital right, condition patent and no redness with a transparent dsg that's clean dry and intact.  Allergies:  No Known Allergies   Past Medical History:  Diagnosis Date  . Arthritis   . ED (erectile dysfunction)   . Gout   . Hypertension   . Prostate cancer ([place])     History:  obtained from chart review. Tobacco/alcohol: denied none  Pt orientation to unit, room and routine. Information packet given to patient/family and safety video watched.  Admission INP armband ID verified with patient/family, and in place. SR up x 2, fall risk assessment complete with Patient and family verbalizing understanding of risks associated with falls. Pt verbalizes an understanding of how to use the call bell and to call for help before getting out of bed.  Skin, clean-dry- intact without evidence of bruising, or skin tears.   No evidence of skin break down noted on exam. no rashes, no ecchymoses, no petechiae, no nodules, no jaundice, no purpura, no wounds    Will cont to monitor and assist as needed.  Salley Slaughter, RN 05/22/2016 9:37 AM

## 2016-05-22 NOTE — ED Notes (Signed)
Awake talking again  He only slept a little while

## 2016-05-23 DIAGNOSIS — Z8546 Personal history of malignant neoplasm of prostate: Secondary | ICD-10-CM | POA: Diagnosis not present

## 2016-05-23 DIAGNOSIS — N12 Tubulo-interstitial nephritis, not specified as acute or chronic: Secondary | ICD-10-CM | POA: Diagnosis present

## 2016-05-23 DIAGNOSIS — N390 Urinary tract infection, site not specified: Secondary | ICD-10-CM | POA: Diagnosis not present

## 2016-05-23 DIAGNOSIS — Z79899 Other long term (current) drug therapy: Secondary | ICD-10-CM | POA: Diagnosis not present

## 2016-05-23 DIAGNOSIS — Z87891 Personal history of nicotine dependence: Secondary | ICD-10-CM | POA: Diagnosis not present

## 2016-05-23 DIAGNOSIS — A419 Sepsis, unspecified organism: Secondary | ICD-10-CM | POA: Diagnosis present

## 2016-05-23 DIAGNOSIS — M545 Low back pain: Secondary | ICD-10-CM | POA: Diagnosis present

## 2016-05-23 DIAGNOSIS — M109 Gout, unspecified: Secondary | ICD-10-CM | POA: Diagnosis present

## 2016-05-23 DIAGNOSIS — I10 Essential (primary) hypertension: Secondary | ICD-10-CM | POA: Diagnosis present

## 2016-05-23 LAB — HIV ANTIBODY (ROUTINE TESTING W REFLEX): HIV Screen 4th Generation wRfx: NONREACTIVE

## 2016-05-23 NOTE — Progress Notes (Signed)
PROGRESS NOTE  Thomas Ayala [id number] DOB: 08-10-1955 DOA: 05/21/2016 PCP: Kennon Portela, MD   LOS: 0 days   Brief Narrative: 60 y.o. male with medical history significant of hyperlipidemia, gout, erectile dysfunction, arthritis, prostate cancer (under watching, no treatment), who presents with fever, chills, dysuria.  Assessment & Plan: Principal Problem:   Lower urinary tract infectious disease Active Problems:   Essential hypertension   Sepsis ([place])  Sepsis due to urinary tract infection and pyelonephritis  - Also complaining of back pain so likely he also had pyelonephritis - Preliminary urine culture with Escherichia coli, final speciation pending - Blood cultures 1 out of 2 with GPC's, Staphylococcus species, staph aureus is negative, likely contaminant - Continue IV antibiotics for another 24 hours, he has low-grade temps still but overall fever curve improved   DVT prophylaxis: Lovenox Code Status: Full Family Communication: discussed with wife bedside Disposition Plan: home once urine cultures are final, likely tomorrow   Consultants:   None   Procedures:   None   Antimicrobials:  Ceftriaxone 12/29 >>  Subjective: - He feels a little bit weaker today, complains of night sweats overnight last night  Objective: Vitals:   05/22/16 2030 05/22/16 2039 05/23/16 0546 05/23/16 0912  BP:  135/87 117/62   Pulse:  88 80   Resp:  19 20   Temp: 98.2 F (36.8 C) 98.8 F (37.1 C) 99.3 F (37.4 C) 99.1 F (37.3 C)  TempSrc: Oral Oral Oral Oral  SpO2:  96% 99%   Weight:      Height:        Intake/Output Summary (Last 24 hours) at 05/23/16 1433 Last data filed at 05/23/16 0634  Gross per 24 hour  Intake             4160 ml  Output              625 ml  Net             3535 ml   Filed Weights   05/21/16 2129  Weight: 99.8 kg (220 lb)    Examination: Constitutional: NAD Vitals:   05/22/16 2030 05/22/16 2039 05/23/16 0546 05/23/16 0912    BP:  135/87 117/62   Pulse:  88 80   Resp:  19 20   Temp: 98.2 F (36.8 C) 98.8 F (37.1 C) 99.3 F (37.4 C) 99.1 F (37.3 C)  TempSrc: Oral Oral Oral Oral  SpO2:  96% 99%   Weight:      Height:       Respiratory: clear to auscultation bilaterally, no wheezing, no crackles.  Cardiovascular: Regular rate and rhythm, no murmurs / rubs / gallops. Abdomen: no tenderness. Bowel sounds positive.  Musculoskeletal: no CVA tenderness    Data Reviewed: I have personally reviewed following labs and imaging studies  CBC:  Recent Labs Lab 05/21/16 2136 05/22/16 0508  WBC 12.5* 13.4*  NEUTROABS 10.4*  --   HGB 12.6* 11.5*  HCT 36.4* 34.3*  MCV 82.7 84.7  PLT 194 0000000   Basic Metabolic Panel:  Recent Labs Lab 05/21/16 2136 05/22/16 0508  NA 140 141  K 3.6 3.9  CL 106 110  CO2 27 24  GLUCOSE 116* 98  BUN 9 8  CREATININE 1.21 1.03  CALCIUM 8.5* 8.0*   GFR: Estimated Creatinine Clearance: 86.6 mL/min (by C-G formula based on SCr of 1.03 mg/dL). Liver Function Tests:  Recent Labs Lab 05/21/16 2136  AST 23  ALT 24  ALKPHOS  51  BILITOT 0.6  PROT 6.6  ALBUMIN 3.2*   No results for input(s): LIPASE, AMYLASE in the last 168 hours. No results for input(s): AMMONIA in the last 168 hours. Coagulation Profile: No results for input(s): INR, PROTIME in the last 168 hours. Cardiac Enzymes: No results for input(s): CKTOTAL, CKMB, CKMBINDEX, TROPONINI in the last 168 hours. BNP (last 3 results) No results for input(s): PROBNP in the last 8760 hours. HbA1C: No results for input(s): HGBA1C in the last 72 hours. CBG: No results for input(s): GLUCAP in the last 168 hours. Lipid Profile: No results for input(s): CHOL, HDL, LDLCALC, TRIG, CHOLHDL, LDLDIRECT in the last 72 hours. Thyroid Function Tests: No results for input(s): TSH, T4TOTAL, FREET4, T3FREE, THYROIDAB in the last 72 hours. Anemia Panel: No results for input(s): VITAMINB12, FOLATE, FERRITIN, TIBC, IRON,  RETICCTPCT in the last 72 hours. Urine analysis:    Component Value Date/Time   COLORURINE YELLOW 05/21/2016 2130   APPEARANCEUR CLOUDY (A) 05/21/2016 2130   LABSPEC 1.020 05/21/2016 2130   PHURINE 6.0 05/21/2016 2130   GLUCOSEU 100 (A) 05/21/2016 2130   HGBUR LARGE (A) 05/21/2016 2130   BILIRUBINUR NEGATIVE 05/21/2016 2130   BILIRUBINUR neg 08/07/2011 0958   KETONESUR NEGATIVE 05/21/2016 2130   PROTEINUR >300 (A) 05/21/2016 2130   UROBILINOGEN negative 08/07/2011 0958   NITRITE POSITIVE (A) 05/21/2016 2130   LEUKOCYTESUR SMALL (A) 05/21/2016 2130   Sepsis Labs: Invalid input(s): PROCALCITONIN, LACTICIDVEN  Recent Results (from the past 240 hour(s))  Urine culture     Status: Abnormal (Preliminary result)   Collection Time: 05/21/16  9:30 PM  Result Value Ref Range Status   Specimen Description URINE, CLEAN CATCH  Final   Special Requests NONE  Final   Culture >=100,000 COLONIES/mL ESCHERICHIA COLI (A)  Final   Report Status PENDING  Incomplete  Culture, blood (routine x 2)     Status: None (Preliminary result)   Collection Time: 05/21/16  9:36 PM  Result Value Ref Range Status   Specimen Description BLOOD RIGHT ANTECUBITAL  Final   Special Requests BOTTLES DRAWN AEROBIC AND ANAEROBIC 5CC  Final   Culture NO GROWTH < 24 HOURS  Final   Report Status PENDING  Incomplete  Culture, blood (routine x 2)     Status: None (Preliminary result)   Collection Time: 05/21/16  9:43 PM  Result Value Ref Range Status   Specimen Description BLOOD LEFT HAND  Final   Special Requests BOTTLES DRAWN AEROBIC AND ANAEROBIC 5CC  Final   Culture  Setup Time   Final    GRAM POSITIVE COCCI IN CLUSTERS ANAEROBIC BOTTLE ONLY CRITICAL RESULT CALLED TO, READ BACK BY AND VERIFIED WITH: C BALL,PHARMD AT 2017 05/22/16 BY L BENFIELD    Culture   Final    GRAM POSITIVE COCCI CULTURE REINCUBATED FOR BETTER GROWTH    Report Status PENDING  Incomplete  Blood Culture ID Panel (Reflexed)     Status:  Abnormal   Collection Time: 05/21/16  9:43 PM  Result Value Ref Range Status   Enterococcus species NOT DETECTED NOT DETECTED Final   Listeria monocytogenes NOT DETECTED NOT DETECTED Final   Staphylococcus species DETECTED (A) NOT DETECTED Final    Comment: CRITICAL RESULT CALLED TO, READ BACK BY AND VERIFIED WITH: C BALL,PHARMD AT 2017 05/22/16 BY L BENFIELD    Staphylococcus aureus NOT DETECTED NOT DETECTED Final   Methicillin resistance NOT DETECTED NOT DETECTED Final   Streptococcus species NOT DETECTED NOT DETECTED Final  Streptococcus agalactiae NOT DETECTED NOT DETECTED Final   Streptococcus pneumoniae NOT DETECTED NOT DETECTED Final   Streptococcus pyogenes NOT DETECTED NOT DETECTED Final   Acinetobacter baumannii NOT DETECTED NOT DETECTED Final   Enterobacteriaceae species NOT DETECTED NOT DETECTED Final   Enterobacter cloacae complex NOT DETECTED NOT DETECTED Final   Escherichia coli NOT DETECTED NOT DETECTED Final   Klebsiella oxytoca NOT DETECTED NOT DETECTED Final   Klebsiella pneumoniae NOT DETECTED NOT DETECTED Final   Proteus species NOT DETECTED NOT DETECTED Final   Serratia marcescens NOT DETECTED NOT DETECTED Final   Haemophilus influenzae NOT DETECTED NOT DETECTED Final   Neisseria meningitidis NOT DETECTED NOT DETECTED Final   Pseudomonas aeruginosa NOT DETECTED NOT DETECTED Final   Candida albicans NOT DETECTED NOT DETECTED Final   Candida glabrata NOT DETECTED NOT DETECTED Final   Candida krusei NOT DETECTED NOT DETECTED Final   Candida parapsilosis NOT DETECTED NOT DETECTED Final   Candida tropicalis NOT DETECTED NOT DETECTED Final      Radiology Studies: Dg Chest 2 View  Result Date: 05/21/2016 CLINICAL DATA:  Cough and fever. EXAM: CHEST  2 VIEW COMPARISON:  None. FINDINGS: The heart size and mediastinal contours are within normal limits. Both lungs are clear. The visualized skeletal structures are unremarkable. IMPRESSION: No active cardiopulmonary  disease. Electronically Signed   By: Nolon Nations M.D.   On: 05/21/2016 22:15     Scheduled Meds: . cefTRIAXone (ROCEPHIN)  IV  1 g Intravenous Q24H  . enoxaparin (LOVENOX) injection  40 mg Subcutaneous Q24H  . sodium chloride flush  3 mL Intravenous Q12H   Continuous Infusions: . sodium chloride 150 mL/hr at 05/23/16 0515    Marzetta Board, MD, PhD [facility] Pager [phone]  If 7PM-7AM, please contact night-coverage [email or web address] Password [facility] 05/23/2016, 2:33 PM

## 2016-05-24 LAB — CULTURE, BLOOD (ROUTINE X 2)

## 2016-05-24 LAB — URINE CULTURE: Culture: >=100000 — AB

## 2016-05-24 MED ORDER — GUAIFENESIN-DM 100-10 MG/5ML PO SYRP: 5.0000 mL | ORAL_SOLUTION | ORAL | 0 refills | Status: DC | PRN

## 2016-05-24 MED ORDER — CIPROFLOXACIN HCL 500 MG PO TABS: 500.0000 mg | ORAL_TABLET | Freq: Two times a day (BID) | ORAL | 0 refills | Status: DC

## 2016-05-24 NOTE — Progress Notes (Signed)
Nsg Discharge Note  Admit Date:  05/21/2016 Discharge date: 05/24/2016   Thomas Ayala to be D/C'd Home per MD order.  AVS completed.  Copy for chart, and copy for patient signed, and dated. Patient/caregiver able to verbalize understanding.  Discharge Medication: Allergies as of 05/24/2016   No Known Allergies     Medication List    STOP taking these medications   allopurinol 300 MG tablet Commonly known as:  ZYLOPRIM     TAKE these medications   amLODipine-benazepril 10-20 MG capsule Commonly known as:  LOTREL Take 1 capsule by mouth daily. What changed:  Another medication with the same name was removed. Continue taking this medication, and follow the directions you see here.   ciprofloxacin 500 MG tablet Commonly known as:  CIPRO Take 1 tablet (500 mg total) by mouth 2 (two) times daily.   colchicine 0.6 MG tablet Take 1 tablet (0.6 mg total) by mouth 2 (two) times daily.   guaiFENesin-dextromethorphan 100-10 MG/5ML syrup Commonly known as:  ROBITUSSIN DM Take 5 mLs by mouth every 4 (four) hours as needed for cough.       Discharge Assessment: Vitals:   05/23/16 2154 05/24/16 0530  BP: 128/87 112/64  Pulse: 72 61  Resp: 19 16  Temp: 98.9 F (37.2 C) 98.2 F (36.8 C)   Skin clean, dry and intact without evidence of skin break down, no evidence of skin tears noted. IV catheter discontinued intact. Site without signs and symptoms of complications - no redness or edema noted at insertion site, patient denies c/o pain - only slight tenderness at site.  Dressing with slight pressure applied.  D/c Instructions-Education: Discharge instructions given to patient/family with verbalized understanding. D/c education completed with patient/family including follow up instructions, medication list, d/c activities limitations if indicated, with other d/c instructions as indicated by MD - patient able to verbalize understanding, all questions fully answered. Patient  instructed to return to ED, call 911, or call MD for any changes in condition.  Patient escorted via [place], and D/C home via private auto.  Salley Slaughter, RN 05/24/2016 11:50 AM

## 2016-05-24 NOTE — Discharge Summary (Signed)
Physician Discharge Summary  Thomas Ayala [id number] DOB: 23-Dec-1955 DOA: 05/21/2016  PCP: Kennon Portela, MD  Admit date: 05/21/2016 Discharge date: 05/24/2016  Admitted From: home Disposition:  home  Recommendations for Outpatient Follow-up:  1. Follow up with PCP in 1-2 weeks 2. Continue Ciprofloxacin for 7 more days  Home Health: none Equipment/Devices: none  Discharge Condition: stable CODE STATUS: Full code Diet recommendation: regular  HPI: Per Dr. Blaine Hamper,  Thomas Ayala is a 61 y.o. male with medical history significant of hyperlipidemia, gout, erectile dysfunction, arthritis, prostate cancer (under watching, no treatment), who presents with fever, chills, dysuria. Patient states that she has been having fever, chills in the past 3 days. She also has dysuria, burning on urination and increased urinary frequency. She has mild cough, no chest pain or shortness of breath. Patient had lower abdominal pain earlier, which has resolved. No nausea, vomiting, diarrhea. She complains sulfa lower back pain, but no flank pain. No unilateral weakness. ED Course: pt was found to have positive urinalysis with small amount of leukocytes and positive nitrite, WBC 12.5, lactate 2.63, creatinine 1.21, negative chest x-ray, temperature 101.5, tachycardia, tachypnea, oxygen saturation 95% on room air. bp is soft. Pt is placed on tele bed for obs.  Hospital Course: Discharge Diagnoses:  Principal Problem:   Lower urinary tract infectious disease Active Problems:   Essential hypertension   Sepsis ([place])  Sepsis due to urinary tract infection and pyelonephritis - Patient was admitted to the hospital with sepsis due to urinary tract infection, he likely also has pyelonephritis as he complained of back pain at home. She was started on ceftriaxone and blood cultures and urine culture were sent. Clinically he improved, his fever curve has improved and he is now afebrile. His urine cultures  speciated Escherichia coli which is pansensitive. His blood cultures did grow 1 out of 2 bottles likely is negative staph, felt to be a contaminant since we do have a clear source for his septic picture on presentation. His antibiotics were narrowed to ciprofloxacin, and he is to continue for 7 additional days. He was advised to follow-up with his primary care provider as well as his urologist in 1-2 weeks.   Discharge Instructions   Allergies as of 05/24/2016   No Known Allergies     Medication List    STOP taking these medications   allopurinol 300 MG tablet Commonly known as:  ZYLOPRIM     TAKE these medications   amLODipine-benazepril 10-20 MG capsule Commonly known as:  LOTREL Take 1 capsule by mouth daily. What changed:  Another medication with the same name was removed. Continue taking this medication, and follow the directions you see here.   ciprofloxacin 500 MG tablet Commonly known as:  CIPRO Take 1 tablet (500 mg total) by mouth 2 (two) times daily.   colchicine 0.6 MG tablet Take 1 tablet (0.6 mg total) by mouth 2 (two) times daily.   guaiFENesin-dextromethorphan 100-10 MG/5ML syrup Commonly known as:  ROBITUSSIN DM Take 5 mLs by mouth every 4 (four) hours as needed for cough.      Follow-up Information    GUEST, Veneda Melter, MD Follow up in 4 week(s).   Specialty:  Internal Medicine         No Known Allergies  Consultations:  none  Procedures/Studies:  Dg Chest 2 View  Result Date: 05/21/2016 CLINICAL DATA:  Cough and fever. EXAM: CHEST  2 VIEW COMPARISON:  None. FINDINGS: The heart size and mediastinal  contours are within normal limits. Both lungs are clear. The visualized skeletal structures are unremarkable. IMPRESSION: No active cardiopulmonary disease. Electronically Signed   By: Nolon Nations M.D.   On: 05/21/2016 22:15      Subjective: - no chest pain, shortness of breath, no abdominal pain, nausea or vomiting.   Discharge  Exam: Vitals:   05/23/16 2154 05/24/16 0530  BP: 128/87 112/64  Pulse: 72 61  Resp: 19 16  Temp: 98.9 F (37.2 C) 98.2 F (36.8 C)   Vitals:   05/23/16 0912 05/23/16 1546 05/23/16 2154 05/24/16 0530  BP:  124/82 128/87 112/64  Pulse:  78 72 61  Resp:  19 19 16   Temp: 99.1 F (37.3 C) 98.8 F (37.1 C) 98.9 F (37.2 C) 98.2 F (36.8 C)  TempSrc: Oral Oral Oral Oral  SpO2:  99% 99%   Weight:      Height:       General: Pt is alert, awake, not in acute distress Cardiovascular: RRR, S1/S2 +, no rubs, no gallops Respiratory: CTA bilaterally, no wheezing, no rhonchi  The results of significant diagnostics from this hospitalization (including imaging, microbiology, ancillary and laboratory) are listed below for reference.     Microbiology: Recent Results (from the past 240 hour(s))  Urine culture     Status: Abnormal   Collection Time: 05/21/16  9:30 PM  Result Value Ref Range Status   Specimen Description URINE, CLEAN CATCH  Final   Special Requests NONE  Final   Culture >=100,000 COLONIES/mL ESCHERICHIA COLI (A)  Final   Report Status 05/24/2016 FINAL  Final   Organism ID, Bacteria ESCHERICHIA COLI (A)  Final      Susceptibility   Escherichia coli - MIC*    AMPICILLIN 8 SENSITIVE Sensitive     CEFAZOLIN <=4 SENSITIVE Sensitive     CEFTRIAXONE <=1 SENSITIVE Sensitive     CIPROFLOXACIN <=0.25 SENSITIVE Sensitive     GENTAMICIN <=1 SENSITIVE Sensitive     IMIPENEM <=0.25 SENSITIVE Sensitive     NITROFURANTOIN <=16 SENSITIVE Sensitive     TRIMETH/SULFA <=20 SENSITIVE Sensitive     AMPICILLIN/SULBACTAM 4 SENSITIVE Sensitive     PIP/TAZO <=4 SENSITIVE Sensitive     Extended ESBL NEGATIVE Sensitive     * >=100,000 COLONIES/mL ESCHERICHIA COLI  Culture, blood (routine x 2)     Status: None (Preliminary result)   Collection Time: 05/21/16  9:36 PM  Result Value Ref Range Status   Specimen Description BLOOD RIGHT ANTECUBITAL  Final   Special Requests BOTTLES DRAWN AEROBIC  AND ANAEROBIC 5CC  Final   Culture NO GROWTH 2 DAYS  Final   Report Status PENDING  Incomplete  Culture, blood (routine x 2)     Status: Abnormal (Preliminary result)   Collection Time: 05/21/16  9:43 PM  Result Value Ref Range Status   Specimen Description BLOOD LEFT HAND  Final   Special Requests BOTTLES DRAWN AEROBIC AND ANAEROBIC 5CC  Final   Culture  Setup Time   Final    GRAM POSITIVE COCCI IN CLUSTERS ANAEROBIC BOTTLE ONLY CRITICAL RESULT CALLED TO, READ BACK BY AND VERIFIED WITH: C BALL,PHARMD AT 2017 05/22/16 BY L BENFIELD    Culture STAPHYLOCOCCUS SPECIES (COAGULASE NEGATIVE) (A)  Final   Report Status PENDING  Incomplete  Blood Culture ID Panel (Reflexed)     Status: Abnormal   Collection Time: 05/21/16  9:43 PM  Result Value Ref Range Status   Enterococcus species NOT DETECTED NOT DETECTED Final  Listeria monocytogenes NOT DETECTED NOT DETECTED Final   Staphylococcus species DETECTED (A) NOT DETECTED Final    Comment: CRITICAL RESULT CALLED TO, READ BACK BY AND VERIFIED WITH: C BALL,PHARMD AT 2017 05/22/16 BY L BENFIELD    Staphylococcus aureus NOT DETECTED NOT DETECTED Final   Methicillin resistance NOT DETECTED NOT DETECTED Final   Streptococcus species NOT DETECTED NOT DETECTED Final   Streptococcus agalactiae NOT DETECTED NOT DETECTED Final   Streptococcus pneumoniae NOT DETECTED NOT DETECTED Final   Streptococcus pyogenes NOT DETECTED NOT DETECTED Final   Acinetobacter baumannii NOT DETECTED NOT DETECTED Final   Enterobacteriaceae species NOT DETECTED NOT DETECTED Final   Enterobacter cloacae complex NOT DETECTED NOT DETECTED Final   Escherichia coli NOT DETECTED NOT DETECTED Final   Klebsiella oxytoca NOT DETECTED NOT DETECTED Final   Klebsiella pneumoniae NOT DETECTED NOT DETECTED Final   Proteus species NOT DETECTED NOT DETECTED Final   Serratia marcescens NOT DETECTED NOT DETECTED Final   Haemophilus influenzae NOT DETECTED NOT DETECTED Final   Neisseria  meningitidis NOT DETECTED NOT DETECTED Final   Pseudomonas aeruginosa NOT DETECTED NOT DETECTED Final   Candida albicans NOT DETECTED NOT DETECTED Final   Candida glabrata NOT DETECTED NOT DETECTED Final   Candida krusei NOT DETECTED NOT DETECTED Final   Candida parapsilosis NOT DETECTED NOT DETECTED Final   Candida tropicalis NOT DETECTED NOT DETECTED Final     Labs: BNP (last 3 results) No results for input(s): BNP in the last 8760 hours. Basic Metabolic Panel:  Recent Labs Lab 05/21/16 2136 05/22/16 0508  NA 140 141  K 3.6 3.9  CL 106 110  CO2 27 24  GLUCOSE 116* 98  BUN 9 8  CREATININE 1.21 1.03  CALCIUM 8.5* 8.0*   Liver Function Tests:  Recent Labs Lab 05/21/16 2136  AST 23  ALT 24  ALKPHOS 51  BILITOT 0.6  PROT 6.6  ALBUMIN 3.2*   No results for input(s): LIPASE, AMYLASE in the last 168 hours. No results for input(s): AMMONIA in the last 168 hours. CBC:  Recent Labs Lab 05/21/16 2136 05/22/16 0508  WBC 12.5* 13.4*  NEUTROABS 10.4*  --   HGB 12.6* 11.5*  HCT 36.4* 34.3*  MCV 82.7 84.7  PLT 194 179   Cardiac Enzymes: No results for input(s): CKTOTAL, CKMB, CKMBINDEX, TROPONINI in the last 168 hours. BNP: Invalid input(s): POCBNP CBG: No results for input(s): GLUCAP in the last 168 hours. D-Dimer No results for input(s): DDIMER in the last 72 hours. Hgb A1c No results for input(s): HGBA1C in the last 72 hours. Lipid Profile No results for input(s): CHOL, HDL, LDLCALC, TRIG, CHOLHDL, LDLDIRECT in the last 72 hours. Thyroid function studies No results for input(s): TSH, T4TOTAL, T3FREE, THYROIDAB in the last 72 hours.  Invalid input(s): FREET3 Anemia work up No results for input(s): VITAMINB12, FOLATE, FERRITIN, TIBC, IRON, RETICCTPCT in the last 72 hours. Urinalysis    Component Value Date/Time   COLORURINE YELLOW 05/21/2016 2130   APPEARANCEUR CLOUDY (A) 05/21/2016 2130   LABSPEC 1.020 05/21/2016 2130   PHURINE 6.0 05/21/2016 2130    GLUCOSEU 100 (A) 05/21/2016 2130   HGBUR LARGE (A) 05/21/2016 2130   BILIRUBINUR NEGATIVE 05/21/2016 2130   BILIRUBINUR neg 08/07/2011 0958   KETONESUR NEGATIVE 05/21/2016 2130   PROTEINUR >300 (A) 05/21/2016 2130   UROBILINOGEN negative 08/07/2011 0958   NITRITE POSITIVE (A) 05/21/2016 2130   LEUKOCYTESUR SMALL (A) 05/21/2016 2130   Sepsis Labs Invalid input(s): PROCALCITONIN,  WBC,  [facility] Recent Results (from the past 240 hour(s))  Urine culture     Status: Abnormal   Collection Time: 05/21/16  9:30 PM  Result Value Ref Range Status   Specimen Description URINE, CLEAN CATCH  Final   Special Requests NONE  Final   Culture >=100,000 COLONIES/mL ESCHERICHIA COLI (A)  Final   Report Status 05/24/2016 FINAL  Final   Organism ID, Bacteria ESCHERICHIA COLI (A)  Final      Susceptibility   Escherichia coli - MIC*    AMPICILLIN 8 SENSITIVE Sensitive     CEFAZOLIN <=4 SENSITIVE Sensitive     CEFTRIAXONE <=1 SENSITIVE Sensitive     CIPROFLOXACIN <=0.25 SENSITIVE Sensitive     GENTAMICIN <=1 SENSITIVE Sensitive     IMIPENEM <=0.25 SENSITIVE Sensitive     NITROFURANTOIN <=16 SENSITIVE Sensitive     TRIMETH/SULFA <=20 SENSITIVE Sensitive     AMPICILLIN/SULBACTAM 4 SENSITIVE Sensitive     PIP/TAZO <=4 SENSITIVE Sensitive     Extended ESBL NEGATIVE Sensitive     * >=100,000 COLONIES/mL ESCHERICHIA COLI  Culture, blood (routine x 2)     Status: None (Preliminary result)   Collection Time: 05/21/16  9:36 PM  Result Value Ref Range Status   Specimen Description BLOOD RIGHT ANTECUBITAL  Final   Special Requests BOTTLES DRAWN AEROBIC AND ANAEROBIC 5CC  Final   Culture NO GROWTH 2 DAYS  Final   Report Status PENDING  Incomplete  Culture, blood (routine x 2)     Status: Abnormal (Preliminary result)   Collection Time: 05/21/16  9:43 PM  Result Value Ref Range Status   Specimen Description BLOOD LEFT HAND  Final   Special Requests BOTTLES DRAWN AEROBIC AND ANAEROBIC 5CC   Final   Culture  Setup Time   Final    GRAM POSITIVE COCCI IN CLUSTERS ANAEROBIC BOTTLE ONLY CRITICAL RESULT CALLED TO, READ BACK BY AND VERIFIED WITH: C BALL,PHARMD AT 2017 05/22/16 BY L BENFIELD    Culture STAPHYLOCOCCUS SPECIES (COAGULASE NEGATIVE) (A)  Final   Report Status PENDING  Incomplete  Blood Culture ID Panel (Reflexed)     Status: Abnormal   Collection Time: 05/21/16  9:43 PM  Result Value Ref Range Status   Enterococcus species NOT DETECTED NOT DETECTED Final   Listeria monocytogenes NOT DETECTED NOT DETECTED Final   Staphylococcus species DETECTED (A) NOT DETECTED Final    Comment: CRITICAL RESULT CALLED TO, READ BACK BY AND VERIFIED WITH: C BALL,PHARMD AT 2017 05/22/16 BY L BENFIELD    Staphylococcus aureus NOT DETECTED NOT DETECTED Final   Methicillin resistance NOT DETECTED NOT DETECTED Final   Streptococcus species NOT DETECTED NOT DETECTED Final   Streptococcus agalactiae NOT DETECTED NOT DETECTED Final   Streptococcus pneumoniae NOT DETECTED NOT DETECTED Final   Streptococcus pyogenes NOT DETECTED NOT DETECTED Final   Acinetobacter baumannii NOT DETECTED NOT DETECTED Final   Enterobacteriaceae species NOT DETECTED NOT DETECTED Final   Enterobacter cloacae complex NOT DETECTED NOT DETECTED Final   Escherichia coli NOT DETECTED NOT DETECTED Final   Klebsiella oxytoca NOT DETECTED NOT DETECTED Final   Klebsiella pneumoniae NOT DETECTED NOT DETECTED Final   Proteus species NOT DETECTED NOT DETECTED Final   Serratia marcescens NOT DETECTED NOT DETECTED Final   Haemophilus influenzae NOT DETECTED NOT DETECTED Final   Neisseria meningitidis NOT DETECTED NOT DETECTED Final   Pseudomonas aeruginosa NOT DETECTED NOT DETECTED Final   Candida albicans NOT DETECTED NOT DETECTED Final   Candida glabrata NOT DETECTED  NOT DETECTED Final   Candida krusei NOT DETECTED NOT DETECTED Final   Candida parapsilosis NOT DETECTED NOT DETECTED Final   Candida tropicalis NOT DETECTED  NOT DETECTED Final    Time coordinating discharge: Over 30 minutes  SIGNED:  Marzetta Board, MD  [facility] 05/24/2016, 12:00 PM Pager [phone]  If 7PM-7AM, please contact night-coverage [email or web address] Password [email or web address]

## 2016-05-25 LAB — GC/CHLAMYDIA PROBE AMP (~~LOC~~) NOT AT ARMC
Chlamydia: NEGATIVE
Neisseria Gonorrhea: NEGATIVE

## 2016-05-26 LAB — CULTURE, BLOOD (ROUTINE X 2): Culture: NO GROWTH

## 2016-12-14 DIAGNOSIS — C61 Malignant neoplasm of prostate: Secondary | ICD-10-CM | POA: Diagnosis not present

## 2017-02-12 DIAGNOSIS — S46811A Strain of other muscles, fascia and tendons at shoulder and upper arm level, right arm, initial encounter: Secondary | ICD-10-CM | POA: Diagnosis not present

## 2017-02-12 DIAGNOSIS — K047 Periapical abscess without sinus: Secondary | ICD-10-CM | POA: Diagnosis not present

## 2017-03-06 DIAGNOSIS — K047 Periapical abscess without sinus: Secondary | ICD-10-CM | POA: Diagnosis not present

## 2017-08-02 DIAGNOSIS — C61 Malignant neoplasm of prostate: Secondary | ICD-10-CM | POA: Diagnosis not present

## 2017-08-02 DIAGNOSIS — R0683 Snoring: Secondary | ICD-10-CM | POA: Diagnosis not present

## 2017-08-04 DIAGNOSIS — R0683 Snoring: Secondary | ICD-10-CM | POA: Insufficient documentation

## 2017-09-06 DIAGNOSIS — C61 Malignant neoplasm of prostate: Secondary | ICD-10-CM | POA: Diagnosis not present

## 2017-09-06 DIAGNOSIS — R809 Proteinuria, unspecified: Secondary | ICD-10-CM | POA: Diagnosis not present

## 2017-09-06 DIAGNOSIS — I10 Essential (primary) hypertension: Secondary | ICD-10-CM | POA: Diagnosis not present

## 2017-09-30 DIAGNOSIS — Z0000 Encounter for general adult medical examination without abnormal findings: Secondary | ICD-10-CM | POA: Diagnosis not present

## 2017-09-30 DIAGNOSIS — M109 Gout, unspecified: Secondary | ICD-10-CM | POA: Diagnosis not present

## 2017-09-30 DIAGNOSIS — I10 Essential (primary) hypertension: Secondary | ICD-10-CM | POA: Diagnosis not present

## 2017-09-30 DIAGNOSIS — C61 Malignant neoplasm of prostate: Secondary | ICD-10-CM | POA: Diagnosis not present

## 2017-10-25 DIAGNOSIS — D649 Anemia, unspecified: Secondary | ICD-10-CM | POA: Diagnosis not present

## 2017-11-09 DIAGNOSIS — G4733 Obstructive sleep apnea (adult) (pediatric): Secondary | ICD-10-CM | POA: Diagnosis not present

## 2017-11-12 DIAGNOSIS — G4733 Obstructive sleep apnea (adult) (pediatric): Secondary | ICD-10-CM | POA: Diagnosis not present

## 2017-11-22 DIAGNOSIS — G4733 Obstructive sleep apnea (adult) (pediatric): Secondary | ICD-10-CM | POA: Diagnosis not present

## 2018-01-03 DIAGNOSIS — G4733 Obstructive sleep apnea (adult) (pediatric): Secondary | ICD-10-CM | POA: Diagnosis not present

## 2018-01-23 DIAGNOSIS — G4733 Obstructive sleep apnea (adult) (pediatric): Secondary | ICD-10-CM | POA: Diagnosis not present

## 2018-01-25 DIAGNOSIS — Z8639 Personal history of other endocrine, nutritional and metabolic disease: Secondary | ICD-10-CM | POA: Diagnosis not present

## 2018-01-25 DIAGNOSIS — K047 Periapical abscess without sinus: Secondary | ICD-10-CM | POA: Diagnosis not present

## 2018-02-22 DIAGNOSIS — G4733 Obstructive sleep apnea (adult) (pediatric): Secondary | ICD-10-CM | POA: Diagnosis not present

## 2019-05-21 ENCOUNTER — Ambulatory Visit: Payer: HRSA Program | Attending: Internal Medicine

## 2019-05-21 DIAGNOSIS — Z20822 Contact with and (suspected) exposure to covid-19: Secondary | ICD-10-CM

## 2019-05-21 DIAGNOSIS — Z20828 Contact with and (suspected) exposure to other viral communicable diseases: Secondary | ICD-10-CM | POA: Diagnosis not present

## 2019-05-23 LAB — NOVEL CORONAVIRUS, NAA: SARS-CoV-2, NAA: NOT DETECTED

## 2019-07-27 ENCOUNTER — Other Ambulatory Visit: Payer: Self-pay

## 2019-07-27 ENCOUNTER — Ambulatory Visit (INDEPENDENT_AMBULATORY_CARE_PROVIDER_SITE_OTHER): Payer: Self-pay | Admitting: Registered Nurse

## 2019-07-27 ENCOUNTER — Encounter: Payer: Self-pay | Admitting: Registered Nurse

## 2019-07-27 VITALS — BP 138/90 | HR 71 | Temp 98.2°F | Ht 68.0 in | Wt 233.2 lb

## 2019-07-27 DIAGNOSIS — Z130 Encounter for screening for diseases of the blood and blood-forming organs and certain disorders involving the immune mechanism: Secondary | ICD-10-CM

## 2019-07-27 DIAGNOSIS — Z7689 Persons encountering health services in other specified circumstances: Secondary | ICD-10-CM

## 2019-07-27 DIAGNOSIS — I10 Essential (primary) hypertension: Secondary | ICD-10-CM

## 2019-07-27 DIAGNOSIS — Z1329 Encounter for screening for other suspected endocrine disorder: Secondary | ICD-10-CM

## 2019-07-27 DIAGNOSIS — M25562 Pain in left knee: Secondary | ICD-10-CM

## 2019-07-27 DIAGNOSIS — Z13220 Encounter for screening for lipoid disorders: Secondary | ICD-10-CM

## 2019-07-27 DIAGNOSIS — Z13228 Encounter for screening for other metabolic disorders: Secondary | ICD-10-CM

## 2019-07-27 DIAGNOSIS — E559 Vitamin D deficiency, unspecified: Secondary | ICD-10-CM

## 2019-07-27 MED ORDER — HYDROCHLOROTHIAZIDE 25 MG PO TABS: 25.0000 mg | ORAL_TABLET | Freq: Every day | ORAL | 3 refills | Status: DC

## 2019-07-27 MED ORDER — DICLOFENAC SODIUM 1 % EX GEL: 4.0000 g | Freq: Two times a day (BID) | CUTANEOUS | 0 refills | Status: DC

## 2019-07-27 NOTE — Patient Instructions (Signed)
° ° ° °  If you have lab work done today you will be contacted with your lab results within the next 2 weeks.  If you have not heard from us then please contact us. The fastest way to get your results is to register for My Chart. ° ° °IF you received an x-ray today, you will receive an invoice from [facility]. Please contact [facility] at [phone] with questions or concerns regarding your invoice.  ° °IF you received labwork today, you will receive an invoice from [facility]. Please contact [facility] at [phone] with questions or concerns regarding your invoice.  ° °Our billing staff will not be able to assist you with questions regarding bills from these companies. ° °You will be contacted with the lab results as soon as they are available. The fastest way to get your results is to activate your My Chart account. Instructions are located on the last page of this paperwork. If you have not heard from us regarding the results in 2 weeks, please contact this office. °  ° ° ° °

## 2019-07-28 LAB — HEMOGLOBIN A1C
Est. average glucose Bld gHb Est-mCnc: 111 mg/dL
Hgb A1c MFr Bld: 5.5 % (ref 4.8–5.6)

## 2019-07-28 LAB — CBC
Hematocrit: 40.1 % (ref 37.5–51.0)
Hemoglobin: 13.6 g/dL (ref 13.0–17.7)
MCH: 28.9 pg (ref 26.6–33.0)
MCHC: 33.9 g/dL (ref 31.5–35.7)
MCV: 85 fL (ref 79–97)
Platelets: 247 10*3/uL (ref 150–450)
RBC: 4.7 x10E6/uL (ref 4.14–5.80)
RDW: 13.1 % (ref 11.6–15.4)
WBC: 8.3 10*3/uL (ref 3.4–10.8)

## 2019-07-28 LAB — LIPID PANEL
Chol/HDL Ratio: 4 ratio (ref 0.0–5.0)
Cholesterol, Total: 166 mg/dL (ref 100–199)
HDL: 41 mg/dL (ref >39)
LDL Chol Calc (NIH): 105 mg/dL — ABNORMAL HIGH (ref 0–99)
Triglycerides: 110 mg/dL (ref 0–149)
VLDL Cholesterol Cal: 20 mg/dL (ref 5–40)

## 2019-07-28 LAB — VITAMIN D 25 HYDROXY (VIT D DEFICIENCY, FRACTURES): Vit D, 25-Hydroxy: 31.5 ng/mL (ref 30.0–100.0)

## 2019-07-30 ENCOUNTER — Telehealth: Payer: Self-pay | Admitting: Registered Nurse

## 2019-07-30 NOTE — Progress Notes (Signed)
Good evening,   Normal results letter, please!  Thank you,  Kathrin Ruddy, NP

## 2019-07-30 NOTE — Progress Notes (Signed)
Lab Letter SENT  

## 2019-07-30 NOTE — Telephone Encounter (Signed)
Pt called and would like a called about labs from 07/27/19. [phone] Please advise.

## 2019-07-31 NOTE — Telephone Encounter (Signed)
LVM for pt to let him know that there has been a lab letter sent out to him and that if there was anything specific that he wanted to discuss please give a cb so that we can assist him.

## 2019-08-10 ENCOUNTER — Encounter: Payer: Self-pay | Admitting: Registered Nurse

## 2019-08-10 NOTE — Progress Notes (Signed)
Established Patient Office Visit  Subjective:  Patient ID: Thomas Ayala, male    DOB: 05-01-1956  Age: 64 y.o. MRN: [id number]  CC:  Chief Complaint  Patient presents with  . Establish Care  . left knee pain    streached it too much. Going on 2.5 weeks     HPI Thomas Ayala presents for visit to establish care and knee pain  Reports that he overstretched his knee almost 3 weeks ago. Mild improvement but has not resolved. No radiation of the pain - no hip or ankle involvement. Some swelling, mild tenderness. No problem bearing weight, no gait issue  Otherwise no concerns. Has not been seen in primary care at our office for some time.   Past Medical History:  Diagnosis Date  . Arthritis   . ED (erectile dysfunction)   . Gout   . Hypertension   . Prostate cancer ([place])     No past surgical history on file.  Family History  Problem Relation Age of Onset  . Dementia Mother   . Lung cancer Father   . Prostate cancer Brother     Social History   Socioeconomic History  . Marital status: Single    Spouse name: Not on file  . Number of children: Not on file  . Years of education: Not on file  . Highest education level: Not on file  Occupational History  . Not on file  Tobacco Use  . Smoking status: Former Smoker    Quit date: 08/07/1991    Years since quitting: 28.0  . Smokeless tobacco: Never Used  Substance and Sexual Activity  . Alcohol use: Yes  . Drug use: Not on file  . Sexual activity: Not on file  Other Topics Concern  . Not on file  Social History Narrative  . Not on file   Social Determinants of Health   Financial Resource Strain:   . Difficulty of Paying Living Expenses:   Food Insecurity:   . Worried About Charity fundraiser in the Last Year:   . Arboriculturist in the Last Year:   Transportation Needs:   . Film/video editor (Medical):   Marland Kitchen Lack of Transportation (Non-Medical):   Physical Activity:   . Days of Exercise per Week:    . Minutes of Exercise per Session:   Stress:   . Feeling of Stress :   Social Connections:   . Frequency of Communication with Friends and Family:   . Frequency of Social Gatherings with Friends and Family:   . Attends Religious Services:   . Active Member of Clubs or Organizations:   . Attends Archivist Meetings:   Marland Kitchen Marital Status:   Intimate Partner Violence:   . Fear of Current or Ex-Partner:   . Emotionally Abused:   Marland Kitchen Physically Abused:   . Sexually Abused:     Outpatient Medications Prior to Visit  Medication Sig Dispense Refill  . amLODipine-benazepril (LOTREL) 10-20 MG capsule Take 1 capsule by mouth daily.  4  . saw palmetto 500 MG capsule Take 500 mg by mouth daily.    . sildenafil (REVATIO) 20 MG tablet Take by mouth.    . colchicine 0.6 MG tablet Take 1 tablet (0.6 mg total) by mouth 2 (two) times daily. (Patient not taking: Reported on 05/21/2016) 60 tablet 3  . ciprofloxacin (CIPRO) 500 MG tablet Take 1 tablet (500 mg total) by mouth 2 (two) times daily. 14 tablet 0  .  guaiFENesin-dextromethorphan (ROBITUSSIN DM) 100-10 MG/5ML syrup Take 5 mLs by mouth every 4 (four) hours as needed for cough. 118 mL 0   No facility-administered medications prior to visit.    No Known Allergies  ROS Review of Systems  Constitutional: Negative.   HENT: Negative.   Eyes: Negative.   Respiratory: Negative.   Cardiovascular: Negative.   Gastrointestinal: Negative.   Endocrine: Negative.   Genitourinary: Negative.   Musculoskeletal: Positive for arthralgias and joint swelling. Negative for back pain, gait problem, myalgias, neck pain and neck stiffness.  Skin: Negative.   Allergic/Immunologic: Negative.   Neurological: Negative.   Hematological: Negative.   Psychiatric/Behavioral: Negative.   All other systems reviewed and are negative.     Objective:    Physical Exam  Constitutional: He is oriented to person, place, and time. He appears well-developed and  well-nourished. No distress.  HENT:  Head: Normocephalic and atraumatic.  Cardiovascular: Normal rate and regular rhythm.  Pulmonary/Chest: Effort normal and breath sounds normal. No respiratory distress.  Musculoskeletal:        General: No tenderness, deformity or edema. Normal range of motion.  Neurological: He is alert and oriented to person, place, and time.  Skin: Skin is warm and dry. No rash noted. He is not diaphoretic. No erythema. No pallor.  Psychiatric: He has a normal mood and affect. His behavior is normal. Judgment and thought content normal.  Nursing note and vitals reviewed.   BP 138/90   Pulse 71   Temp 98.2 F (36.8 C) (Temporal)   Ht 5\' 8"  (1.727 m)   Wt 233 lb 3.2 oz (105.8 kg)   SpO2 95%   BMI 35.46 kg/m  Wt Readings from Last 3 Encounters:  07/27/19 233 lb 3.2 oz (105.8 kg)  05/21/16 220 lb (99.8 kg)  08/07/11 222 lb 9.6 oz (101 kg)     Health Maintenance Due  Topic Date Due  . Hepatitis C Screening  Never done  . COLONOSCOPY  Never done  . INFLUENZA VACCINE  Never done    There are no preventive care reminders to display for this patient.  Lab Results  Component Value Date   TSH 0.718 08/07/2011   Lab Results  Component Value Date   WBC 8.3 07/27/2019   HGB 13.6 07/27/2019   HCT 40.1 07/27/2019   MCV 85 07/27/2019   PLT 247 07/27/2019   Lab Results  Component Value Date   NA 141 05/22/2016   K 3.9 05/22/2016   CO2 24 05/22/2016   GLUCOSE 98 05/22/2016   BUN 8 05/22/2016   CREATININE 1.03 05/22/2016   BILITOT 0.6 05/21/2016   ALKPHOS 51 05/21/2016   AST 23 05/21/2016   ALT 24 05/21/2016   PROT 6.6 05/21/2016   ALBUMIN 3.2 (L) 05/21/2016   CALCIUM 8.0 (L) 05/22/2016   ANIONGAP 7 05/22/2016   Lab Results  Component Value Date   CHOL 166 07/27/2019   Lab Results  Component Value Date   HDL 41 07/27/2019   Lab Results  Component Value Date   LDLCALC 105 (H) 07/27/2019   Lab Results  Component Value Date   TRIG 110  07/27/2019   Lab Results  Component Value Date   CHOLHDL 4.0 07/27/2019   Lab Results  Component Value Date   HGBA1C 5.5 07/27/2019      Assessment & Plan:   Problem List Items Addressed This Visit      Cardiovascular and Mediastinum   Essential hypertension   Relevant Medications  sildenafil (REVATIO) 20 MG tablet   hydrochlorothiazide (HYDRODIURIL) 25 MG tablet    Other Visit Diagnoses    Vitamin D deficiency    -  Primary   Relevant Orders   Vitamin D, 25-hydroxy (Completed)   Screening for endocrine, metabolic and immunity disorder       Relevant Orders   Hemoglobin A1c (Completed)   CBC (Completed)   Lipid screening       Relevant Orders   Lipid panel (Completed)   Acute pain of left knee       Relevant Medications   diclofenac Sodium (VOLTAREN) 1 % GEL   Encounter to establish care          Meds ordered this encounter  Medications  . hydrochlorothiazide (HYDRODIURIL) 25 MG tablet    Sig: Take 1 tablet (25 mg total) by mouth daily.    Dispense:  90 tablet    Refill:  3    Order Specific Question:   Supervising Provider    Answer:   Delia Chimes A [id number]  . diclofenac Sodium (VOLTAREN) 1 % GEL    Sig: Apply 4 g topically in the morning and at bedtime.    Dispense:  150 g    Refill:  0    Order Specific Question:   Supervising Provider    Answer:   Forrest Moron [id number]    Follow-up: No follow-ups on file.   PLAN  Diclofenac gel  Refill hctz  Labs drawn, will follow up as warranted  Likely strain of knee, does not appear to be any structural damage on exam  Patient encouraged to call clinic with any questions, comments, or concerns.  Maximiano Coss, NP

## 2019-08-13 ENCOUNTER — Other Ambulatory Visit: Payer: Self-pay

## 2019-08-13 ENCOUNTER — Ambulatory Visit (INDEPENDENT_AMBULATORY_CARE_PROVIDER_SITE_OTHER): Payer: Self-pay | Admitting: Registered Nurse

## 2019-08-13 ENCOUNTER — Encounter: Payer: Self-pay | Admitting: Registered Nurse

## 2019-08-13 ENCOUNTER — Telehealth: Payer: Self-pay | Admitting: Registered Nurse

## 2019-08-13 VITALS — BP 137/89 | HR 64 | Temp 98.2°F | Resp 17 | Ht 68.0 in | Wt 233.0 lb

## 2019-08-13 DIAGNOSIS — M25561 Pain in right knee: Secondary | ICD-10-CM

## 2019-08-13 MED ORDER — PREDNISONE 20 MG PO TABS: ORAL_TABLET | ORAL | 0 refills | Status: AC

## 2019-08-13 NOTE — Progress Notes (Signed)
Acute Office Visit  Subjective:    Patient ID: Thomas Ayala, male    DOB: 1956-05-19, 64 y.o.   MRN: [id number]  Chief Complaint  Patient presents with  . Knee Pain    patient has been left knee pain for a week now he thought it was better and was trying to do some squats    HPI Patient is in today for left knee pain Onset a week ago  Was addressed for this issue in the past - he felt it was much better, tried some deep squats, pain started again. Steadily worsening over past week. Located anterior and just superior and inferior to knee joint. No deep pain. Mild swelling. No radiation. No gait abnormalities.  No further concerns.   Past Medical History:  Diagnosis Date  . Arthritis   . ED (erectile dysfunction)   . Gout   . Hypertension   . Prostate cancer ([place])     No past surgical history on file.  Family History  Problem Relation Age of Onset  . Dementia Mother   . Lung cancer Father   . Prostate cancer Brother     Social History   Socioeconomic History  . Marital status: Single    Spouse name: Not on file  . Number of children: Not on file  . Years of education: Not on file  . Highest education level: Not on file  Occupational History  . Not on file  Tobacco Use  . Smoking status: Former Smoker    Quit date: 08/07/1991    Years since quitting: 28.0  . Smokeless tobacco: Never Used  Substance and Sexual Activity  . Alcohol use: Yes  . Drug use: Not on file  . Sexual activity: Not on file  Other Topics Concern  . Not on file  Social History Narrative  . Not on file   Social Determinants of Health   Financial Resource Strain:   . Difficulty of Paying Living Expenses:   Food Insecurity:   . Worried About Charity fundraiser in the Last Year:   . Arboriculturist in the Last Year:   Transportation Needs:   . Film/video editor (Medical):   Marland Kitchen Lack of Transportation (Non-Medical):   Physical Activity:   . Days of Exercise per Week:   .  Minutes of Exercise per Session:   Stress:   . Feeling of Stress :   Social Connections:   . Frequency of Communication with Friends and Family:   . Frequency of Social Gatherings with Friends and Family:   . Attends Religious Services:   . Active Member of Clubs or Organizations:   . Attends Archivist Meetings:   Marland Kitchen Marital Status:   Intimate Partner Violence:   . Fear of Current or Ex-Partner:   . Emotionally Abused:   Marland Kitchen Physically Abused:   . Sexually Abused:     Outpatient Medications Prior to Visit  Medication Sig Dispense Refill  . amLODipine-benazepril (LOTREL) 10-20 MG capsule Take 1 capsule by mouth daily.  4  . diclofenac Sodium (VOLTAREN) 1 % GEL Apply 4 g topically in the morning and at bedtime. 150 g 0  . hydrochlorothiazide (HYDRODIURIL) 25 MG tablet Take 1 tablet (25 mg total) by mouth daily. 90 tablet 3  . saw palmetto 500 MG capsule Take 500 mg by mouth daily.    . sildenafil (REVATIO) 20 MG tablet Take by mouth.    . colchicine 0.6 MG tablet  Take 1 tablet (0.6 mg total) by mouth 2 (two) times daily. (Patient not taking: Reported on 05/21/2016) 60 tablet 3   No facility-administered medications prior to visit.    No Known Allergies  Review of Systems  Constitutional: Negative.   HENT: Negative.   Eyes: Negative.   Respiratory: Negative.   Cardiovascular: Negative.   Gastrointestinal: Negative.   Endocrine: Negative.   Genitourinary: Negative.   Musculoskeletal: Positive for arthralgias (l knee) and joint swelling (l knee). Negative for back pain, gait problem, myalgias, neck pain and neck stiffness.  Skin: Negative.   Allergic/Immunologic: Negative.   Neurological: Negative.   Hematological: Negative.   Psychiatric/Behavioral: Negative.   All other systems reviewed and are negative.      Objective:    Physical Exam Constitutional:      General: He is not in acute distress.    Appearance: Normal appearance. He is obese. He is not  ill-appearing, toxic-appearing or diaphoretic.  Cardiovascular:     Rate and Rhythm: Normal rate and regular rhythm.  Musculoskeletal:        General: Swelling (anterior, mild L knee) and tenderness (mild L knee) present. No deformity or signs of injury. Normal range of motion.     Right lower leg: No edema.     Left lower leg: No edema.  Skin:    General: Skin is warm and dry.     Coloration: Skin is not jaundiced or pale.     Findings: No bruising, erythema, lesion or rash.  Neurological:     General: No focal deficit present.     Mental Status: He is alert and oriented to person, place, and time. Mental status is at baseline.  Psychiatric:        Mood and Affect: Mood normal.        Behavior: Behavior normal.        Thought Content: Thought content normal.        Judgment: Judgment normal.     BP 137/89   Pulse 64   Temp 98.2 F (36.8 C) (Temporal)   Resp 17   Ht 5\' 8"  (1.727 m)   Wt 233 lb (105.7 kg)   SpO2 96%   BMI 35.43 kg/m  Wt Readings from Last 3 Encounters:  08/13/19 233 lb (105.7 kg)  07/27/19 233 lb 3.2 oz (105.8 kg)  05/21/16 220 lb (99.8 kg)    Health Maintenance Due  Topic Date Due  . Hepatitis C Screening  Never done    There are no preventive care reminders to display for this patient.   Lab Results  Component Value Date   TSH 0.718 08/07/2011   Lab Results  Component Value Date   WBC 8.3 07/27/2019   HGB 13.6 07/27/2019   HCT 40.1 07/27/2019   MCV 85 07/27/2019   PLT 247 07/27/2019   Lab Results  Component Value Date   NA 141 05/22/2016   K 3.9 05/22/2016   CO2 24 05/22/2016   GLUCOSE 98 05/22/2016   BUN 8 05/22/2016   CREATININE 1.03 05/22/2016   BILITOT 0.6 05/21/2016   ALKPHOS 51 05/21/2016   AST 23 05/21/2016   ALT 24 05/21/2016   PROT 6.6 05/21/2016   ALBUMIN 3.2 (L) 05/21/2016   CALCIUM 8.0 (L) 05/22/2016   ANIONGAP 7 05/22/2016   Lab Results  Component Value Date   CHOL 166 07/27/2019   Lab Results  Component  Value Date   HDL 41 07/27/2019   Lab Results  Component Value Date   LDLCALC 105 (H) 07/27/2019   Lab Results  Component Value Date   TRIG 110 07/27/2019   Lab Results  Component Value Date   CHOLHDL 4.0 07/27/2019   Lab Results  Component Value Date   HGBA1C 5.5 07/27/2019       Assessment & Plan:   Problem List Items Addressed This Visit    None    Visit Diagnoses    Acute pain of right knee    -  Primary   Relevant Medications   predniSONE (DELTASONE) 20 MG tablet       Meds ordered this encounter  Medications  . predniSONE (DELTASONE) 20 MG tablet    Sig: Take 3 tablets (60 mg total) by mouth daily with breakfast for 3 days, THEN 2 tablets (40 mg total) daily with breakfast for 3 days, THEN 1 tablet (20 mg total) daily with breakfast for 3 days.    Dispense:  18 tablet    Refill:  0    Order Specific Question:   Supervising Provider    Answer:   Forrest Moron [id number]   PLAN  Has had success with prednisone in past - will give taper  Encouraged safe movement with knee  Encouraged stretching and flexibility exercises  Return to clinic precautions given  Exam unremarkable for structural damage to knee  Patient encouraged to call clinic with any questions, comments, or concerns.   Maximiano Coss, NP

## 2019-08-13 NOTE — Telephone Encounter (Signed)
Pt called about having left knee pain. Per pt is wanting provider to call in some prednisone medication he has gotten in the past from a different doctors office and is wanted to see if our provider can call that in for him. He did make and apt for today 3/22 at 1:50. Per pt is wanting to see if he can just get it called in because he is having workers at his house today. [phone] Please advise.

## 2019-08-13 NOTE — Telephone Encounter (Signed)
Called patient to let him know that Orland Mustard can not just order medications . Will discuss further at appointment today

## 2019-08-13 NOTE — Patient Instructions (Signed)
° ° ° °  If you have lab work done today you will be contacted with your lab results within the next 2 weeks.  If you have not heard from us then please contact us. The fastest way to get your results is to register for My Chart. ° ° °IF you received an x-ray today, you will receive an invoice from [facility]. Please contact  Radiology at [phone] with questions or concerns regarding your invoice.  ° °IF you received labwork today, you will receive an invoice from [facility]. Please contact [facility] at [phone] with questions or concerns regarding your invoice.  ° °Our billing staff will not be able to assist you with questions regarding bills from these companies. ° °You will be contacted with the lab results as soon as they are available. The fastest way to get your results is to activate your My Chart account. Instructions are located on the last page of this paperwork. If you have not heard from us regarding the results in 2 weeks, please contact this office. °  ° ° ° °

## 2019-09-17 ENCOUNTER — Telehealth: Payer: Self-pay | Admitting: Registered Nurse

## 2019-09-17 NOTE — Telephone Encounter (Signed)
Patient has a request for Vitamin D and its not on the medication list and also wants to know does he need medication due to his blood pressure being elevated last week> Please Advise.

## 2019-09-17 NOTE — Telephone Encounter (Signed)
Pt is requesting rx for Vitamin D. Also stated his urologist told him is BP was elevated last week. He wants to know if he needs another medication to help with this. Please advise.

## 2019-09-19 NOTE — Telephone Encounter (Signed)
The last Vit D and BP readings we have on him are normal - he can start an OTC Vit D supplement if he desires, but no prescription strength supplementation is warranted at this time.  Thank you  Kathrin Ruddy, NP

## 2019-09-19 NOTE — Telephone Encounter (Signed)
Attempted to call the patient and LVM.

## 2019-10-19 ENCOUNTER — Other Ambulatory Visit: Payer: Self-pay | Admitting: Registered Nurse

## 2019-10-19 DIAGNOSIS — M25561 Pain in right knee: Secondary | ICD-10-CM

## 2019-10-19 NOTE — Telephone Encounter (Signed)
Patient states he is still having some left knee pain and that's what the medication was prescribed for.  Patient is requesting a refill of the following medications: Requested Prescriptions   Pending Prescriptions Disp Refills  . predniSONE (DELTASONE) 20 MG tablet 18 tablet 0    Sig: Take 3 tablets (60 mg total) by mouth daily with breakfast for 3 days, THEN 2 tablets (40 mg total) daily with breakfast for 3 days, THEN 1 tablet (20 mg total) daily with breakfast for 3 days.    Date of patient request: 10/19/2019 Last office visit: 08/13/2019 Date of last refill: 08/13/2019 Last refill amount: 18 tablets  Follow up time period per chart:N/A

## 2019-10-19 NOTE — Telephone Encounter (Signed)
Patient is requesting a refill of the following medications: Requested Prescriptions    No prescriptions requested or ordered in this encounter  Prednisone  Date of patient request: 10/19/2019 Last office visit: 08/13/2019 Date of last refill: 08/22/2019 Last refill amount: 18 tabs 9 days

## 2019-10-19 NOTE — Telephone Encounter (Signed)
Medication Refill - Medication:  predniSONE (DELTASONE) 20 MG tablet  Has the patient contacted their pharmacy?  Yes advised to call office.   Preferred Pharmacy (with phone number or street name):  [facility] [place], [place] - [address] Phone:  [phone]  Fax:  [phone]       Agent: Please be advised that RX refills may take up to 3 business days. We ask that you follow-up with your pharmacy.

## 2019-10-19 NOTE — Telephone Encounter (Signed)
Pt. Called, requesting a refill on Prednisone. Spoke with pt., Prednisone not on medication list. Reports it was prescribed to him for left knee pain. Reports knee is hurting again. Please advise pt.

## 2019-12-02 ENCOUNTER — Other Ambulatory Visit: Payer: Self-pay | Admitting: Registered Nurse

## 2019-12-02 DIAGNOSIS — M25561 Pain in right knee: Secondary | ICD-10-CM

## 2019-12-02 MED ORDER — PREDNISONE 20 MG PO TABS: 20.0000 mg | ORAL_TABLET | Freq: Every day | ORAL | 0 refills | Status: DC

## 2020-05-27 ENCOUNTER — Ambulatory Visit
Admission: EM | Admit: 2020-05-27 | Discharge: 2020-05-27 | Disposition: A | Discharge status: Home / Self Care | Payer: 59 | Attending: Emergency Medicine | Admitting: Emergency Medicine

## 2020-05-27 DIAGNOSIS — Z20822 Contact with and (suspected) exposure to covid-19: Secondary | ICD-10-CM | POA: Diagnosis not present

## 2020-05-27 MED ORDER — BENZONATATE 200 MG PO CAPS: 200.0000 mg | ORAL_CAPSULE | Freq: Three times a day (TID) | ORAL | 0 refills | Status: AC | PRN

## 2020-05-27 NOTE — Discharge Instructions (Addendum)
Covid test pending, monitor my chart for results Tessalon for cough or may use over-the-counter Robitussin/Delsym Rest and fluids Ibuprofen and Tylenol as needed for any headaches body aches fevers Follow-up if not improving or worsening

## 2020-05-27 NOTE — ED Triage Notes (Signed)
Pt c/o coughing, fever, and covid exposure x4 days

## 2020-05-27 NOTE — ED Provider Notes (Addendum)
[facility]    CSN: [id number] Arrival date & time: 05/27/20  0907      History   Chief Complaint Chief Complaint  Patient presents with  . Cough  Cough, COIVD exposure  HPI Thomas Ayala is a 65 y.o. male history of hypertension presenting today for evaluation of URI symptoms.  Reports Covid exposure at home.  Has had some low-grade fevers and cough with slight throat irritation over the past 2 days.  HPI  Past Medical History:  Diagnosis Date  . Arthritis   . ED (erectile dysfunction)   . Gout   . Hypertension   . Prostate cancer [facility])     Patient Active Problem List   Diagnosis Date Noted  . Sepsis (H[facility]) 05/21/2016  . Lower urinary tract infectious disease 05/21/2016  . Gout 08/07/2011  . Essential hypertension 08/07/2011  . ED (erectile dysfunction) 08/07/2011    History reviewed. No pertinent surgical history.     Home Medications    Prior to Admission medications   Medication Sig Start Date End Date Taking? Authorizing Provider  benzonatate (TESSALON) 200 MG capsule Take 1 capsule (200 mg total) by mouth 3 (three) times daily as needed for up to 7 days for cough. 05/27/20 06/03/20 Yes Kota Ciancio C, PA-C  amLODipine-benazepril (LOTREL) 10-20 MG capsule Take 1 capsule by mouth daily. 05/14/16   [provider]  colchicine 0.6 MG tablet Take 1 tablet (0.6 mg total) by mouth 2 (two) times daily. Patient not taking: Reported on 05/21/2016 09/16/11   Anders Simmonds, PA-C  diclofenac Sodium (VOLTAREN) 1 % GEL Apply 4 g topically in the morning and at bedtime. 07/27/19   Janeece Agee, NP  hydrochlorothiazide (HYDRODIURIL) 25 MG tablet Take 1 tablet (25 mg total) by mouth daily. 07/27/19   Janeece Agee, NP  predniSONE (DELTASONE) 20 MG tablet Take 1 tablet (20 mg total) by mouth daily with breakfast. 12/02/19   Janeece Agee, NP  saw palmetto 500 MG capsule Take 500 mg by mouth daily.    [provider]  sildenafil  (REVATIO) 20 MG tablet Take by mouth. 06/29/19   [provider]    Family History Family History  Problem Relation Age of Onset  . Dementia Mother   . Lung cancer Father   . Prostate cancer Brother     Social History Social History   Tobacco Use  . Smoking status: Former Smoker    Quit date: 08/07/1991    Years since quitting: 28.8  . Smokeless tobacco: Never Used  Substance Use Topics  . Alcohol use: Yes     Allergies   Patient has no known allergies.   Review of Systems Review of Systems  Constitutional: Positive for fever. Negative for activity change, appetite change, chills and fatigue.  HENT: Positive for congestion and sore throat. Negative for ear pain, rhinorrhea, sinus pressure and trouble swallowing.   Eyes: Negative for discharge and redness.  Respiratory: Positive for cough. Negative for chest tightness and shortness of breath.   Cardiovascular: Negative for chest pain.  Gastrointestinal: Negative for abdominal pain, diarrhea, nausea and vomiting.  Musculoskeletal: Negative for myalgias.  Skin: Negative for rash.  Neurological: Negative for dizziness, light-headedness and headaches.     Physical Exam Triage Vital Signs ED Triage Vitals  Enc Vitals Group     BP      Pulse      Resp      Temp      Temp src  SpO2      Weight      Height      Head Circumference      Peak Flow      Pain Score      Pain Loc      Pain Edu?      Excl. in GC?    No data found.  Updated Vital Signs BP (!) 139/94 (BP Location: Left Arm)   Pulse 66   Temp 98.1 F (36.7 C) (Oral)   Resp 16   SpO2 96%   Visual Acuity Right Eye Distance:   Left Eye Distance:   Bilateral Distance:    Right Eye Near:   Left Eye Near:    Bilateral Near:     Physical Exam Vitals and nursing note reviewed.  Constitutional:      Appearance: He is well-developed and well-nourished.     Comments: No acute distress  HENT:     Head: Normocephalic and atraumatic.      Ears:     Comments: Bilateral ears without tenderness to palpation of external auricle, tragus and mastoid, EAC's without erythema or swelling, TM's with good bony landmarks and cone of light. Non erythematous.     Nose: Nose normal.     Mouth/Throat:     Comments: Oral mucosa pink and moist, no tonsillar enlargement or exudate. Posterior pharynx patent and nonerythematous, no uvula deviation or swelling. Normal phonation. Eyes:     Conjunctiva/sclera: Conjunctivae normal.  Cardiovascular:     Rate and Rhythm: Normal rate and regular rhythm.  Pulmonary:     Effort: Pulmonary effort is normal. No respiratory distress.     Comments: Breathing comfortably at rest, CTABL, no wheezing, rales or other adventitious sounds auscultated Abdominal:     General: There is no distension.  Musculoskeletal:        General: Normal range of motion.     Cervical back: Neck supple.  Skin:    General: Skin is warm and dry.  Neurological:     Mental Status: He is alert and oriented to person, place, and time.  Psychiatric:        Mood and Affect: Mood and affect normal.      UC Treatments / Results  Labs (all labs ordered are listed, but only abnormal results are displayed) Labs Reviewed  NOVEL CORONAVIRUS, NAA    EKG   Radiology No results found.  Procedures Procedures (including critical care time)  Medications Ordered in UC Medications - No data to display  Initial Impression / Assessment and Plan / UC Course  I have reviewed the triage vital signs and the nursing notes.  Pertinent labs & imaging results that were available during my care of the patient were reviewed by me and considered in my medical decision making (see chart for details).     Covid PCR pending, exam reassuring, recommend symptomatic and supportive care rest and fluids.  Discussed strict return precautions. Patient verbalized understanding and is agreeable with plan.  Final Clinical Impressions(s) / UC  Diagnoses   Final diagnoses:  Suspected COVID-19 virus infection     Discharge Instructions     Covid test pending, monitor my chart for results Tessalon for cough or may use over-the-counter Robitussin/Delsym Rest and fluids Ibuprofen and Tylenol as needed for any headaches body aches fevers Follow-up if not improving or worsening    ED Prescriptions    Medication Sig Dispense Auth. Provider   benzonatate (TESSALON) 200 MG capsule Take 1 capsule (  200 mg total) by mouth 3 (three) times daily as needed for up to 7 days for cough. 28 capsule Sebron Mcmahill, Kenton C, PA-C     PDMP not reviewed this encounter.   Janith Lima, PA-C 05/27/20 [id number]    Janith Lima, PA-C 05/27/20 1302

## 2020-05-29 LAB — NOVEL CORONAVIRUS, NAA: SARS-CoV-2, NAA: NOT DETECTED

## 2020-05-29 LAB — SARS-COV-2, NAA 2 DAY TAT

## 2020-06-18 DIAGNOSIS — Z91199 Patient's noncompliance with other medical treatment and regimen due to unspecified reason: Secondary | ICD-10-CM | POA: Insufficient documentation

## 2020-07-27 ENCOUNTER — Other Ambulatory Visit: Payer: Self-pay | Admitting: Registered Nurse

## 2020-07-27 DIAGNOSIS — I10 Essential (primary) hypertension: Secondary | ICD-10-CM

## 2020-07-27 NOTE — Telephone Encounter (Signed)
Requested medication (s) are due for refill today: yes  Requested medication (s) are on the active medication list: yes  Last refill:  07/27/19  Future visit scheduled: no  Notes to clinic:  needs appt   Requested Prescriptions  Pending Prescriptions Disp Refills   hydrochlorothiazide (HYDRODIURIL) 25 MG tablet [Pharmacy Med Name: HYDROCHLOROTHIAZIDE 25MG  TABLETS] 90 tablet 3    Sig: TAKE 1 TABLET(25 MG) BY MOUTH DAILY      Cardiovascular: Diuretics - Thiazide Failed - 07/27/2020  9:02 AM      Failed - Ca in normal range and within 360 days    Calcium  Date Value Ref Range Status  05/22/2016 8.0 (L) 8.9 - 10.3 mg/dL Final          Failed - Cr in normal range and within 360 days    Creat  Date Value Ref Range Status  08/07/2011 0.97 0.50 - 1.35 mg/dL Final   Creatinine, Ser  Date Value Ref Range Status  05/22/2016 1.03 0.61 - 1.24 mg/dL Final          Failed - K in normal range and within 360 days    Potassium  Date Value Ref Range Status  05/22/2016 3.9 3.5 - 5.1 mmol/L Final          Failed - Na in normal range and within 360 days    Sodium  Date Value Ref Range Status  05/22/2016 141 135 - 145 mmol/L Final          Failed - Last BP in normal range    BP Readings from Last 1 Encounters:  05/27/20 (!) 139/94          Failed - Valid encounter within last 6 months    Recent Outpatient Visits           11 months ago Acute pain of right knee   [facility], Delfino Lovett, NP   1 year ago Vitamin D deficiency   [facility], Delfino Lovett, NP   8 years ago Gout   [facility] at [facility] Benn Moulder, MD

## 2020-08-15 ENCOUNTER — Ambulatory Visit (INDEPENDENT_AMBULATORY_CARE_PROVIDER_SITE_OTHER): Payer: 59 | Admitting: Registered Nurse

## 2020-08-15 ENCOUNTER — Other Ambulatory Visit: Payer: Self-pay

## 2020-08-15 VITALS — BP 120/88 | HR 80 | Temp 98.0°F | Resp 18 | Ht 68.0 in | Wt 230.2 lb

## 2020-08-15 DIAGNOSIS — K047 Periapical abscess without sinus: Secondary | ICD-10-CM | POA: Diagnosis not present

## 2020-08-15 MED ORDER — AMOXICILLIN-POT CLAVULANATE 875-125 MG PO TABS
1.0000 | ORAL_TABLET | Freq: Two times a day (BID) | ORAL | 0 refills | Status: DC
Start: 2020-08-15 — End: 2021-01-01

## 2020-08-15 NOTE — Patient Instructions (Signed)
° ° ° °  If you have lab work done today you will be contacted with your lab results within the next 2 weeks.  If you have not heard from us then please contact us. The fastest way to get your results is to register for My Chart. ° ° °IF you received an x-ray today, you will receive an invoice from [facility]. Please contact [facility] at [phone] with questions or concerns regarding your invoice.  ° °IF you received labwork today, you will receive an invoice from [facility]. Please contact [facility] at [phone] with questions or concerns regarding your invoice.  ° °Our billing staff will not be able to assist you with questions regarding bills from these companies. ° °You will be contacted with the lab results as soon as they are available. The fastest way to get your results is to activate your My Chart account. Instructions are located on the last page of this paperwork. If you have not heard from us regarding the results in 2 weeks, please contact this office. °  ° ° ° °

## 2020-10-13 ENCOUNTER — Encounter: Payer: Self-pay | Admitting: Registered Nurse

## 2020-10-13 ENCOUNTER — Other Ambulatory Visit: Payer: Self-pay

## 2020-10-13 ENCOUNTER — Ambulatory Visit (INDEPENDENT_AMBULATORY_CARE_PROVIDER_SITE_OTHER): Payer: 59 | Admitting: Registered Nurse

## 2020-10-13 VITALS — BP 118/80 | HR 60 | Temp 98.2°F | Resp 18 | Ht 68.0 in | Wt 226.2 lb

## 2020-10-13 DIAGNOSIS — R2233 Localized swelling, mass and lump, upper limb, bilateral: Secondary | ICD-10-CM | POA: Diagnosis not present

## 2020-10-13 DIAGNOSIS — E559 Vitamin D deficiency, unspecified: Secondary | ICD-10-CM

## 2020-10-13 DIAGNOSIS — Z13228 Encounter for screening for other metabolic disorders: Secondary | ICD-10-CM

## 2020-10-13 DIAGNOSIS — Z0000 Encounter for general adult medical examination without abnormal findings: Secondary | ICD-10-CM

## 2020-10-13 DIAGNOSIS — Z130 Encounter for screening for diseases of the blood and blood-forming organs and certain disorders involving the immune mechanism: Secondary | ICD-10-CM

## 2020-10-13 DIAGNOSIS — R972 Elevated prostate specific antigen [PSA]: Secondary | ICD-10-CM

## 2020-10-13 DIAGNOSIS — Z13220 Encounter for screening for lipoid disorders: Secondary | ICD-10-CM

## 2020-10-13 DIAGNOSIS — Z1329 Encounter for screening for other suspected endocrine disorder: Secondary | ICD-10-CM

## 2020-10-13 DIAGNOSIS — G8929 Other chronic pain: Secondary | ICD-10-CM

## 2020-10-13 DIAGNOSIS — M25511 Pain in right shoulder: Secondary | ICD-10-CM

## 2020-10-13 MED ORDER — METHOCARBAMOL 500 MG PO TABS: 500.0000 mg | ORAL_TABLET | Freq: Every evening | ORAL | 0 refills | Status: DC | PRN

## 2020-10-13 NOTE — Progress Notes (Signed)
Established Patient Office Visit  Subjective:  Patient ID: Thomas Ayala, male    DOB: 1956-03-19  Age: 65 y.o. MRN: [id number]  CC:  Chief Complaint  Patient presents with  . Annual Exam    Patient states he is here for an CPE and some labworrk    HPI Thomas Ayala presents for CPE and labs  No acute concerns  Histories reviewed with patient, updated as warranted  Swelling on elbow. Ongoing for some time Firm, nontender, mobile. R elbow. There is a small similar patch on L elbow. Appears to be shallow Hx of gout but doubts this to be related.  Pain in R shoulder Hurt when working out earlier this year Occasional tingling in first three digits Strength and sensation retained Pain is improving.  Past Medical History:  Diagnosis Date  . Arthritis   . ED (erectile dysfunction)   . Gout   . Hypertension   . Prostate cancer ([place])     No past surgical history on file.  Family History  Problem Relation Age of Onset  . Dementia Mother   . Lung cancer Father   . Prostate cancer Brother     Social History   Socioeconomic History  . Marital status: Single    Spouse name: Not on file  . Number of children: Not on file  . Years of education: Not on file  . Highest education level: Not on file  Occupational History  . Not on file  Tobacco Use  . Smoking status: Former Smoker    Quit date: 08/07/1991    Years since quitting: 29.2  . Smokeless tobacco: Never Used  Substance and Sexual Activity  . Alcohol use: Yes  . Drug use: Not on file  . Sexual activity: Not on file  Other Topics Concern  . Not on file  Social History Narrative  . Not on file   Social Determinants of Health   Financial Resource Strain: Not on file  Food Insecurity: Not on file  Transportation Needs: Not on file  Physical Activity: Not on file  Stress: Not on file  Social Connections: Not on file  Intimate Partner Violence: Not on file    Outpatient Medications Prior to  Visit  Medication Sig Dispense Refill  . amLODipine-benazepril (LOTREL) 10-20 MG capsule Take 1 capsule by mouth daily.  4  . hydrochlorothiazide (HYDRODIURIL) 25 MG tablet TAKE 1 TABLET(25 MG) BY MOUTH DAILY 90 tablet 0  . amoxicillin-clavulanate (AUGMENTIN) 875-125 MG tablet Take 1 tablet by mouth 2 (two) times daily. (Patient not taking: Reported on 10/13/2020) 20 tablet 0  . colchicine 0.6 MG tablet Take 1 tablet (0.6 mg total) by mouth 2 (two) times daily. (Patient not taking: No sig reported) 60 tablet 3  . diclofenac Sodium (VOLTAREN) 1 % GEL Apply 4 g topically in the morning and at bedtime. (Patient not taking: Reported on 10/13/2020) 150 g 0  . predniSONE (DELTASONE) 20 MG tablet Take 1 tablet (20 mg total) by mouth daily with breakfast. (Patient not taking: Reported on 10/13/2020) 20 tablet 0  . saw palmetto 500 MG capsule Take 500 mg by mouth daily. (Patient not taking: Reported on 10/13/2020)    . sildenafil (REVATIO) 20 MG tablet Take by mouth. (Patient not taking: Reported on 10/13/2020)     No facility-administered medications prior to visit.    No Known Allergies  ROS Review of Systems  Constitutional: Negative.   HENT: Negative.   Eyes: Negative.   Respiratory:  Negative.   Cardiovascular: Negative.   Gastrointestinal: Negative.   Genitourinary: Negative.   Musculoskeletal: Negative.   Skin: Negative.   Neurological: Negative.   Psychiatric/Behavioral: Negative.   All other systems reviewed and are negative.     Objective:    Physical Exam Vitals and nursing note reviewed.  Constitutional:      General: He is not in acute distress.    Appearance: Normal appearance. He is normal weight. He is not ill-appearing, toxic-appearing or diaphoretic.  HENT:     Head: Normocephalic and atraumatic.     Right Ear: Tympanic membrane, ear canal and external ear normal. There is no impacted cerumen.     Left Ear: Tympanic membrane, ear canal and external ear normal. There is  no impacted cerumen.     Nose: Nose normal. No congestion or rhinorrhea.     Mouth/Throat:     Mouth: Mucous membranes are moist.     Pharynx: Oropharynx is clear. No oropharyngeal exudate or posterior oropharyngeal erythema.  Eyes:     General: No scleral icterus.       Right eye: No discharge.        Left eye: No discharge.     Extraocular Movements: Extraocular movements intact.     Conjunctiva/sclera: Conjunctivae normal.     Pupils: Pupils are equal, round, and reactive to light.  Neck:     Vascular: No carotid bruit.  Cardiovascular:     Rate and Rhythm: Normal rate and regular rhythm.     Pulses: Normal pulses.     Heart sounds: Normal heart sounds. No murmur heard. No friction rub. No gallop.   Pulmonary:     Effort: Pulmonary effort is normal. No respiratory distress.     Breath sounds: Normal breath sounds. No stridor. No wheezing, rhonchi or rales.  Chest:     Chest wall: No tenderness.  Abdominal:     General: Abdomen is flat. Bowel sounds are normal. There is no distension.     Palpations: Abdomen is soft. There is no mass.     Tenderness: There is no abdominal tenderness. There is no right CVA tenderness, left CVA tenderness, guarding or rebound.     Hernia: No hernia is present.  Musculoskeletal:        General: No swelling, tenderness, deformity or signs of injury. Normal range of motion.     Cervical back: Normal range of motion and neck supple. No rigidity or tenderness.     Right lower leg: No edema.     Left lower leg: No edema.  Lymphadenopathy:     Cervical: No cervical adenopathy.  Skin:    General: Skin is warm and dry.     Capillary Refill: Capillary refill takes less than 2 seconds.     Coloration: Skin is not jaundiced or pale.     Findings: Lesion (large, hard mass on R elbow. mobile, nontender.) present. No bruising, erythema or rash.  Neurological:     General: No focal deficit present.     Mental Status: He is alert and oriented to person,  place, and time. Mental status is at baseline.     Cranial Nerves: No cranial nerve deficit.     Motor: No weakness.     Gait: Gait normal.  Psychiatric:        Mood and Affect: Mood normal.        Behavior: Behavior normal.        Thought Content: Thought content normal.  Judgment: Judgment normal.     BP 118/80   Pulse 60   Temp 98.2 F (36.8 C) (Temporal)   Resp 18   Ht 5\' 8"  (1.727 m)   Wt 226 lb 3.2 oz (102.6 kg)   SpO2 99%   BMI 34.39 kg/m  Wt Readings from Last 3 Encounters:  10/13/20 226 lb 3.2 oz (102.6 kg)  08/15/20 230 lb 3.2 oz (104.4 kg)  08/13/19 233 lb (105.7 kg)     There are no preventive care reminders to display for this patient.  There are no preventive care reminders to display for this patient.  Lab Results  Component Value Date   TSH 0.718 08/07/2011   Lab Results  Component Value Date   WBC 8.3 07/27/2019   HGB 13.6 07/27/2019   HCT 40.1 07/27/2019   MCV 85 07/27/2019   PLT 247 07/27/2019   Lab Results  Component Value Date   NA 141 05/22/2016   K 3.9 05/22/2016   CO2 24 05/22/2016   GLUCOSE 98 05/22/2016   BUN 8 05/22/2016   CREATININE 1.03 05/22/2016   BILITOT 0.6 05/21/2016   ALKPHOS 51 05/21/2016   AST 23 05/21/2016   ALT 24 05/21/2016   PROT 6.6 05/21/2016   ALBUMIN 3.2 (L) 05/21/2016   CALCIUM 8.0 (L) 05/22/2016   ANIONGAP 7 05/22/2016   Lab Results  Component Value Date   CHOL 166 07/27/2019   Lab Results  Component Value Date   HDL 41 07/27/2019   Lab Results  Component Value Date   LDLCALC 105 (H) 07/27/2019   Lab Results  Component Value Date   TRIG 110 07/27/2019   Lab Results  Component Value Date   CHOLHDL 4.0 07/27/2019   Lab Results  Component Value Date   HGBA1C 5.5 07/27/2019      Assessment & Plan:   Problem List Items Addressed This Visit   None   Visit Diagnoses    Annual physical exam    -  Primary   Screening for endocrine, metabolic and immunity disorder       Relevant  Orders   Comprehensive metabolic panel   CBC with Differential/Platelet   TSH   Hemoglobin A1c   Lipid screening       Relevant Orders   Lipid panel   Mass of both elbows       Relevant Orders   Ambulatory referral to Dermatology   Chronic right shoulder pain       Relevant Medications   methocarbamol (ROBAXIN) 500 MG tablet   Vitamin D deficiency       Relevant Orders   Vitamin D (25 hydroxy)   Elevated PSA       Relevant Orders   PSA      Meds ordered this encounter  Medications  . methocarbamol (ROBAXIN) 500 MG tablet    Sig: Take 1 tablet (500 mg total) by mouth at bedtime as needed for muscle spasms.    Dispense:  90 tablet    Refill:  0    Order Specific Question:   Supervising Provider    Answer:   Carlota Raspberry, JEFFREY R [[id number]]    Follow-up: No follow-ups on file.   PLAN  Refer to derm for benign appearing mass of elbows.   Labs collected. Will follow up with the patient as warranted.  Methocarbamol for shoulder pain. Discussed in depth stretching cervical paraspinal, trapezius, and pectoral muscles.  Otherwise exam unremarkable  Follow up in 6 mo or  sooner if labs indicate.  Patient encouraged to call clinic with any questions, comments, or concerns.  Maximiano Coss, NP

## 2020-10-13 NOTE — Patient Instructions (Addendum)
Mr. Carriger-   Labs should be back tomorrow  Let's check in again in about 6 mo at the latest.  I've referred you to dermatology for the bump on your elbow. They should call within 5-6 weeks.  Let me know if the shoulder gets worse or doesn't get better.  Thanks,  Rich    If you have lab work done today you will be contacted with your lab results within the next 2 weeks.  If you have not heard from [place] then please contact us. The fastest way to get your results is to register for My Chart.   IF you received an x-ray today, you will receive an invoice from [facility]. Please contact [facility] at [phone] with questions or concerns regarding your invoice.   IF you received labwork today, you will receive an invoice from [facility]. Please contact [facility] at [phone] with questions or concerns regarding your invoice.   Our billing staff will not be able to assist you with questions regarding bills from these companies.  You will be contacted with the lab results as soon as they are available. The fastest way to get your results is to activate your My Chart account. Instructions are located on the last page of this paperwork. If you have not heard from [place] regarding the results in 2 weeks, please contact this office.

## 2020-10-14 LAB — CBC WITH DIFFERENTIAL/PLATELET
Basophils Absolute: 0.1 10*3/uL (ref 0.0–0.1)
Basophils Relative: 0.9 % (ref 0.0–3.0)
Eosinophils Absolute: 0.1 10*3/uL (ref 0.0–0.7)
Eosinophils Relative: 1.9 % (ref 0.0–5.0)
HCT: 41.9 % (ref 39.0–52.0)
Hemoglobin: 14.2 g/dL (ref 13.0–17.0)
Lymphocytes Relative: 44.3 % (ref 12.0–46.0)
Lymphs Abs: 3 10*3/uL (ref 0.7–4.0)
MCHC: 33.9 g/dL (ref 30.0–36.0)
MCV: 85.7 fl (ref 78.0–100.0)
Monocytes Absolute: 0.6 10*3/uL (ref 0.1–1.0)
Monocytes Relative: 8.4 % (ref 3.0–12.0)
Neutro Abs: 3 10*3/uL (ref 1.4–7.7)
Neutrophils Relative %: 44.5 % (ref 43.0–77.0)
Platelets: 209 10*3/uL (ref 150.0–400.0)
RBC: 4.88 Mil/uL (ref 4.22–5.81)
RDW: 14.3 % (ref 11.5–15.5)
WBC: 6.8 10*3/uL (ref 4.0–10.5)

## 2020-10-14 LAB — COMPREHENSIVE METABOLIC PANEL
ALT: 22 U/L (ref 0–53)
AST: 23 U/L (ref 0–37)
Albumin: 4.6 g/dL (ref 3.5–5.2)
Alkaline Phosphatase: 45 U/L (ref 39–117)
BUN: 12 mg/dL (ref 6–23)
CO2: 26 mEq/L (ref 19–32)
Calcium: 9.6 mg/dL (ref 8.4–10.5)
Chloride: 104 mEq/L (ref 96–112)
Creatinine, Ser: 1.04 mg/dL (ref 0.40–1.50)
GFR: 75.71 mL/min (ref >60.00)
Glucose, Bld: 84 mg/dL (ref 70–99)
Potassium: 4 mEq/L (ref 3.5–5.1)
Sodium: 140 mEq/L (ref 135–145)
Total Bilirubin: 0.5 mg/dL (ref 0.2–1.2)
Total Protein: 7.3 g/dL (ref 6.0–8.3)

## 2020-10-14 LAB — LIPID PANEL
Cholesterol: 202 mg/dL — ABNORMAL HIGH (ref 0–200)
HDL: 42.5 mg/dL (ref >39.00)
LDL Cholesterol: 142 mg/dL — ABNORMAL HIGH (ref 0–99)
NonHDL: 159.38
Total CHOL/HDL Ratio: 5
Triglycerides: 86 mg/dL (ref 0.0–149.0)
VLDL: 17.2 mg/dL (ref 0.0–40.0)

## 2020-10-14 LAB — VITAMIN D 25 HYDROXY (VIT D DEFICIENCY, FRACTURES): VITD: 35.89 ng/mL (ref 30.00–100.00)

## 2020-10-14 LAB — PSA: PSA: 10.13 ng/mL — ABNORMAL HIGH (ref 0.10–4.00)

## 2020-10-14 LAB — HEMOGLOBIN A1C: Hgb A1c MFr Bld: 5.6 % (ref 4.6–6.5)

## 2020-10-14 LAB — TSH: TSH: 0.91 u[IU]/mL (ref 0.35–4.50)

## 2020-10-15 ENCOUNTER — Other Ambulatory Visit: Payer: Self-pay | Admitting: Registered Nurse

## 2020-10-15 ENCOUNTER — Telehealth: Payer: Self-pay | Admitting: Registered Nurse

## 2020-10-15 DIAGNOSIS — R972 Elevated prostate specific antigen [PSA]: Secondary | ICD-10-CM

## 2020-10-15 NOTE — Telephone Encounter (Signed)
Patient would like a vitamin D supplement called in - [facility] on [address], [place]

## 2020-10-17 NOTE — Telephone Encounter (Signed)
His Vit D is wnl. Recommend OTC supplementation of 1000 units or so daily.   Thanks,  Denice Paradise

## 2020-10-21 NOTE — Telephone Encounter (Signed)
Left a vm message with pcp recommendations.

## 2020-11-03 ENCOUNTER — Other Ambulatory Visit: Payer: Self-pay | Admitting: Registered Nurse

## 2020-11-03 DIAGNOSIS — I10 Essential (primary) hypertension: Secondary | ICD-10-CM

## 2020-12-17 DIAGNOSIS — I10 Essential (primary) hypertension: Secondary | ICD-10-CM | POA: Diagnosis not present

## 2020-12-17 DIAGNOSIS — Z1329 Encounter for screening for other suspected endocrine disorder: Secondary | ICD-10-CM | POA: Diagnosis not present

## 2020-12-17 DIAGNOSIS — Z136 Encounter for screening for cardiovascular disorders: Secondary | ICD-10-CM | POA: Diagnosis not present

## 2020-12-17 DIAGNOSIS — Z0001 Encounter for general adult medical examination with abnormal findings: Secondary | ICD-10-CM | POA: Diagnosis not present

## 2020-12-17 DIAGNOSIS — Z131 Encounter for screening for diabetes mellitus: Secondary | ICD-10-CM | POA: Diagnosis not present

## 2020-12-17 DIAGNOSIS — E559 Vitamin D deficiency, unspecified: Secondary | ICD-10-CM | POA: Diagnosis not present

## 2020-12-31 DIAGNOSIS — N3000 Acute cystitis without hematuria: Secondary | ICD-10-CM | POA: Diagnosis not present

## 2020-12-31 DIAGNOSIS — Z0001 Encounter for general adult medical examination with abnormal findings: Secondary | ICD-10-CM | POA: Diagnosis not present

## 2020-12-31 DIAGNOSIS — E782 Mixed hyperlipidemia: Secondary | ICD-10-CM | POA: Diagnosis not present

## 2020-12-31 DIAGNOSIS — R300 Dysuria: Secondary | ICD-10-CM | POA: Diagnosis not present

## 2020-12-31 DIAGNOSIS — I10 Essential (primary) hypertension: Secondary | ICD-10-CM | POA: Diagnosis not present

## 2021-01-01 ENCOUNTER — Other Ambulatory Visit: Payer: Self-pay

## 2021-01-01 ENCOUNTER — Ambulatory Visit
Admission: EM | Admit: 2021-01-01 | Discharge: 2021-01-01 | Disposition: A | Discharge status: Home / Self Care | Payer: 59 | Attending: Urgent Care | Admitting: Urgent Care

## 2021-01-01 DIAGNOSIS — R509 Fever, unspecified: Secondary | ICD-10-CM

## 2021-01-01 DIAGNOSIS — R0602 Shortness of breath: Secondary | ICD-10-CM

## 2021-01-01 DIAGNOSIS — B349 Viral infection, unspecified: Secondary | ICD-10-CM | POA: Diagnosis not present

## 2021-01-01 DIAGNOSIS — N3000 Acute cystitis without hematuria: Secondary | ICD-10-CM

## 2021-01-01 NOTE — ED Triage Notes (Signed)
Pt c/o chills and fever - >101 per pt - onset yesterday evening. Also states headache, body ache, and slight constipation. Denies N&V, constipation/diarrhea, cough, congestion, drainage. Tried tylenol at home without resolution. Also states he was Dx UTI yesterday.

## 2021-01-01 NOTE — ED Provider Notes (Signed)
[place]   MRN: [id number] DOB: 18-Apr-1956  Subjective:   Thomas Ayala is a 65 y.o. male presenting for 1 day history of acute onset malaise, fatigue, fever, chills. Has a history of COVID 54 and last episode was in Dec 2021-Jan 2022. This episode feels the same. No COVID vaccination.  He actually started treatment for an urinary tract infection today.  Thinks that the antibiotic might be Keflex.  Reports that his urinary symptoms are slightly improved since starting the antibiotic this morning.  Denies runny or stuffy nose, sore throat, cough, chest pain, shortness of breath, wheezing, nausea, vomiting, abdominal pain.  No current facility-administered medications for this encounter.  Current Outpatient Medications:    amLODipine-benazepril (LOTREL) 10-20 MG capsule, Take 1 capsule by mouth daily., Disp: , Rfl: 4   colchicine 0.6 MG tablet, Take 1 tablet (0.6 mg total) by mouth 2 (two) times daily. (Patient not taking: No sig reported), Disp: 60 tablet, Rfl: 3   hydrochlorothiazide (HYDRODIURIL) 25 MG tablet, TAKE 1 TABLET(25 MG) BY MOUTH DAILY, Disp: 90 tablet, Rfl: 0   methocarbamol (ROBAXIN) 500 MG tablet, Take 1 tablet (500 mg total) by mouth at bedtime as needed for muscle spasms., Disp: 90 tablet, Rfl: 0   sildenafil (REVATIO) 20 MG tablet, Take by mouth. (Patient not taking: Reported on 10/13/2020), Disp: , Rfl:    No Known Allergies  Past Medical History:  Diagnosis Date   Arthritis    ED (erectile dysfunction)    Gout    Hypertension    Prostate cancer ([place])      History reviewed. No pertinent surgical history.  Family History  Problem Relation Age of Onset   Dementia Mother    Lung cancer Father    Prostate cancer Brother     Social History   Tobacco Use   Smoking status: Former    Types: Cigarettes    Quit date: 08/07/1991    Years since quitting: 29.4   Smokeless tobacco: Never  Substance Use Topics   Alcohol use: Yes     ROS   Objective:   Vitals: BP (!) 160/92 (BP Location: Right Arm)   Pulse 88   Temp 98.6 F (37 C) (Oral)   Resp 18   SpO2 95%   BP 142/91 on recheck.   Physical Exam Constitutional:      General: He is not in acute distress.    Appearance: Normal appearance. He is well-developed. He is not ill-appearing, toxic-appearing or diaphoretic.  HENT:     Head: Normocephalic and atraumatic.     Right Ear: External ear normal.     Left Ear: External ear normal.     Nose: Nose normal.     Mouth/Throat:     Mouth: Mucous membranes are moist.     Pharynx: Oropharynx is clear.  Eyes:     General: No scleral icterus.    Extraocular Movements: Extraocular movements intact.     Pupils: Pupils are equal, round, and reactive to light.  Cardiovascular:     Rate and Rhythm: Normal rate and regular rhythm.     Heart sounds: Normal heart sounds. No murmur heard.   No friction rub. No gallop.  Pulmonary:     Effort: Pulmonary effort is normal. No respiratory distress.     Breath sounds: Normal breath sounds. No stridor. No wheezing, rhonchi or rales.  Abdominal:     General: Bowel sounds are normal. There is no distension.     Palpations: Abdomen  is soft. There is no mass.     Tenderness: There is no abdominal tenderness. There is no right CVA tenderness, left CVA tenderness, guarding or rebound.  Skin:    General: Skin is warm and dry.  Neurological:     Mental Status: He is alert and oriented to person, place, and time.  Psychiatric:        Mood and Affect: Mood normal.        Behavior: Behavior normal.        Thought Content: Thought content normal.        Judgment: Judgment normal.   Recent Results (from the past 2160 hour(s))  Comprehensive metabolic panel     Status: None   Collection Time: 10/13/20  3:31 PM  Result Value Ref Range   Sodium 140 135 - 145 mEq/L   Potassium 4.0 3.5 - 5.1 mEq/L   Chloride 104 96 - 112 mEq/L   CO2 26 19 - 32 mEq/L   Glucose, Bld 84 70 -  99 mg/dL   BUN 12 6 - 23 mg/dL   Creatinine, Ser 1.04 0.40 - 1.50 mg/dL   Total Bilirubin 0.5 0.2 - 1.2 mg/dL   Alkaline Phosphatase 45 39 - 117 U/L   AST 23 0 - 37 U/L   ALT 22 0 - 53 U/L   Total Protein 7.3 6.0 - 8.3 g/dL   Albumin 4.6 3.5 - 5.2 g/dL   GFR 75.71 >60.00 mL/min    Comment: Calculated using the CKD-EPI Creatinine Equation (2021)   Calcium 9.6 8.4 - 10.5 mg/dL  CBC with Differential/Platelet     Status: None   Collection Time: 10/13/20  3:31 PM  Result Value Ref Range   WBC 6.8 4.0 - 10.5 K/uL   RBC 4.88 4.22 - 5.81 Mil/uL   Hemoglobin 14.2 13.0 - 17.0 g/dL   HCT 41.9 39.0 - 52.0 %   MCV 85.7 78.0 - 100.0 fl   MCHC 33.9 30.0 - 36.0 g/dL   RDW 14.3 11.5 - 15.5 %   Platelets 209.0 150.0 - 400.0 K/uL   Neutrophils Relative % 44.5 43.0 - 77.0 %   Lymphocytes Relative 44.3 12.0 - 46.0 %   Monocytes Relative 8.4 3.0 - 12.0 %   Eosinophils Relative 1.9 0.0 - 5.0 %   Basophils Relative 0.9 0.0 - 3.0 %   Neutro Abs 3.0 1.4 - 7.7 K/uL   Lymphs Abs 3.0 0.7 - 4.0 K/uL   Monocytes Absolute 0.6 0.1 - 1.0 K/uL   Eosinophils Absolute 0.1 0.0 - 0.7 K/uL   Basophils Absolute 0.1 0.0 - 0.1 K/uL  Lipid panel     Status: Abnormal   Collection Time: 10/13/20  3:31 PM  Result Value Ref Range   Cholesterol 202 (H) 0 - 200 mg/dL    Comment: ATP III Classification       Desirable:  < 200 mg/dL               Borderline High:  200 - 239 mg/dL          High:  > = 240 mg/dL   Triglycerides 86.0 0.0 - 149.0 mg/dL    Comment: Normal:  <150 mg/dLBorderline High:  150 - 199 mg/dL   HDL 42.50 >39.00 mg/dL   VLDL 17.2 0.0 - 40.0 mg/dL   LDL Cholesterol 142 (H) 0 - 99 mg/dL   Total CHOL/HDL Ratio 5     Comment:  Men          Women1/2 Average Risk     3.4          3.3Average Risk          5.0          4.42X Average Risk          9.6          7.13X Average Risk          15.0          11.0                       NonHDL 159.38     Comment: NOTE:  Non-HDL goal should be 30 mg/dL  higher than patient's LDL goal (i.e. LDL goal of < 70 mg/dL, would have non-HDL goal of < 100 mg/dL)  TSH     Status: None   Collection Time: 10/13/20  3:31 PM  Result Value Ref Range   TSH 0.91 0.35 - 4.50 uIU/mL  Hemoglobin A1c     Status: None   Collection Time: 10/13/20  3:31 PM  Result Value Ref Range   Hgb A1c MFr Bld 5.6 4.6 - 6.5 %    Comment: Glycemic Control Guidelines for People with Diabetes:Non Diabetic:  <6%Goal of Therapy: <7%Additional Action Suggested:  >8%   PSA     Status: Abnormal   Collection Time: 10/13/20  3:31 PM  Result Value Ref Range   PSA 10.13 (H) 0.10 - 4.00 ng/mL    Comment: Test performed using Access Hybritech PSA Assay, a parmagnetic partical, chemiluminecent immunoassay.  Vitamin D (25 hydroxy)     Status: None   Collection Time: 10/13/20  3:31 PM  Result Value Ref Range   VITD 35.89 30.00 - 100.00 ng/mL     Assessment and Plan :   PDMP not reviewed this encounter.  1. Viral syndrome   2. Fever, unspecified   3. Acute cystitis without hematuria   4. Shortness of breath     Patient would be a good candidate to undergo treatment with Paxlovid should his COVID test be positive.  Recommend supportive care.  Maintain antibiotic course for treatment of his UTI.  Patient has a clear cardiopulmonary exam, deferred imaging as such.  Counseled patient on potential for adverse effects with medications prescribed/recommended today, ER and return-to-clinic precautions discussed, patient verbalized understanding.    Jaynee Eagles, PA-C 01/01/21 1714

## 2021-01-01 NOTE — Discharge Instructions (Addendum)
We will notify you of your COVID-19 test results as they arrive and may take between 24 to 48 hours.  I encourage you to sign up for MyChart if you have not already done so as this can be the easiest way for [place] to communicate results to you online or through a phone app.  In the meantime, if you develop worsening symptoms including fever, chest pain, shortness of breath despite our current treatment plan then please report to the emergency room as this may be a sign of worsening status from possible COVID-19 infection.  Otherwise, we will manage this as a viral syndrome. For sore throat or cough try using a honey-based tea. Use 3 teaspoons of honey with juice squeezed from half lemon. Place shaved pieces of ginger into 1/2-1 cup of water and warm over stove top. Then mix the ingredients and repeat every 4 hours as needed. Please take Tylenol '500mg'$ -'650mg'$  every 6 hours for aches and pains, fevers. Hydrate very well with at least 2 liters of water. Eat light meals such as soups to replenish electrolytes and soft fruits, veggies. Start an antihistamine like Zyrtec, Allegra or Claritin for postnasal drainage, sinus congestion.    Make sure you continue to take your antibiotic for your urinary tract infection.

## 2021-01-02 LAB — NOVEL CORONAVIRUS, NAA: SARS-CoV-2, NAA: NOT DETECTED

## 2021-01-02 LAB — SARS-COV-2, NAA 2 DAY TAT

## 2021-01-30 DIAGNOSIS — L720 Epidermal cyst: Secondary | ICD-10-CM | POA: Diagnosis not present

## 2021-01-30 DIAGNOSIS — L918 Other hypertrophic disorders of the skin: Secondary | ICD-10-CM | POA: Diagnosis not present

## 2021-02-17 ENCOUNTER — Other Ambulatory Visit: Payer: Self-pay | Admitting: Registered Nurse

## 2021-02-17 DIAGNOSIS — I10 Essential (primary) hypertension: Secondary | ICD-10-CM

## 2021-02-23 DIAGNOSIS — M25521 Pain in right elbow: Secondary | ICD-10-CM | POA: Diagnosis not present

## 2021-02-23 DIAGNOSIS — G5601 Carpal tunnel syndrome, right upper limb: Secondary | ICD-10-CM | POA: Diagnosis not present

## 2021-03-23 NOTE — Progress Notes (Signed)
Established Patient Office Visit  Subjective:  Patient ID: Thomas Ayala, male    DOB: [birth year]  Age: 65 y.o. MRN: [id number]  CC:  Chief Complaint  Patient presents with   Medication Management    Patient states he would like to discuss a medication he is taking that might have a recall. And also a abscess on his gum    HPI Thomas Ayala presents for dental infection, med check  Medication: Had heard about recalls on H2RA and metformin ER,  Wants to make sure he does not need to change any medications.  Dental infection: Ongoing, has happened before. Notes pain and swelling No dysphagia or trouble swallowing Denies systemic symptoms. He is trying to get in to see his general dentist but does not feel he can wait.  Otherwise no acute concerns.   Past Medical History:  Diagnosis Date   Arthritis    ED (erectile dysfunction)    Gout    Hypertension    Prostate cancer ([place])     No past surgical history on file.  Family History  Problem Relation Age of Onset   Dementia Mother    Lung cancer Father    Prostate cancer Brother     Social History   Socioeconomic History   Marital status: Single    Spouse name: Not on file   Number of children: Not on file   Years of education: Not on file   Highest education level: Not on file  Occupational History   Not on file  Tobacco Use   Smoking status: Former    Types: Cigarettes    Quit date: 08/07/1991    Years since quitting: 29.6   Smokeless tobacco: Never  Substance and Sexual Activity   Alcohol use: Yes   Drug use: Not on file   Sexual activity: Not on file  Other Topics Concern   Not on file  Social History Narrative   Not on file   Social Determinants of Health   Financial Resource Strain: Not on file  Food Insecurity: Not on file  Transportation Needs: Not on file  Physical Activity: Not on file  Stress: Not on file  Social Connections: Not on file  Intimate Partner Violence: Not on  file    Outpatient Medications Prior to Visit  Medication Sig Dispense Refill   amLODipine-benazepril (LOTREL) 10-20 MG capsule Take 1 capsule by mouth daily.  4   sildenafil (REVATIO) 20 MG tablet Take by mouth. (Patient not taking: Reported on 10/13/2020)     diclofenac Sodium (VOLTAREN) 1 % GEL Apply 4 g topically in the morning and at bedtime. (Patient not taking: Reported on 10/13/2020) 150 g 0   hydrochlorothiazide (HYDRODIURIL) 25 MG tablet TAKE 1 TABLET(25 MG) BY MOUTH DAILY 90 tablet 0   predniSONE (DELTASONE) 20 MG tablet Take 1 tablet (20 mg total) by mouth daily with breakfast. (Patient not taking: Reported on 10/13/2020) 20 tablet 0   saw palmetto 500 MG capsule Take 500 mg by mouth daily. (Patient not taking: Reported on 10/13/2020)     colchicine 0.6 MG tablet Take 1 tablet (0.6 mg total) by mouth 2 (two) times daily. (Patient not taking: No sig reported) 60 tablet 3   No facility-administered medications prior to visit.    No Known Allergies  ROS Review of Systems  Constitutional: Negative.   HENT: Negative.    Eyes: Negative.   Respiratory: Negative.    Cardiovascular: Negative.   Gastrointestinal: Negative.  Genitourinary: Negative.   Musculoskeletal: Negative.   Skin: Negative.   Neurological: Negative.   Psychiatric/Behavioral: Negative.    All other systems reviewed and are negative.    Objective:    Physical Exam Constitutional:      General: He is not in acute distress.    Appearance: Normal appearance. He is normal weight. He is not ill-appearing, toxic-appearing or diaphoretic.  HENT:     Mouth/Throat:     Lips: Pink. No lesions.     Mouth: Mucous membranes are moist.     Dentition: Abnormal dentition. Dental tenderness, gingival swelling and dental abscesses present.     Tongue: No lesions. Tongue does not deviate from midline.     Palate: No mass and lesions.     Pharynx: Oropharynx is clear. Uvula midline.     Tonsils: No tonsillar exudate or  tonsillar abscesses.  Cardiovascular:     Rate and Rhythm: Normal rate and regular rhythm.     Heart sounds: Normal heart sounds. No murmur heard.   No friction rub. No gallop.  Pulmonary:     Effort: Pulmonary effort is normal. No respiratory distress.     Breath sounds: Normal breath sounds. No stridor. No wheezing, rhonchi or rales.  Chest:     Chest wall: No tenderness.  Neurological:     General: No focal deficit present.     Mental Status: He is alert and oriented to person, place, and time. Mental status is at baseline.  Psychiatric:        Mood and Affect: Mood normal.        Behavior: Behavior normal.        Thought Content: Thought content normal.        Judgment: Judgment normal.    BP 120/88   Pulse 80   Temp 98 F (36.7 C) (Temporal)   Resp 18   Ht 5\' 8"  (1.727 m)   Wt 230 lb 3.2 oz (104.4 kg)   SpO2 98%   BMI 35.00 kg/m  Wt Readings from Last 3 Encounters:  10/13/20 226 lb 3.2 oz (102.6 kg)  08/15/20 230 lb 3.2 oz (104.4 kg)  08/13/19 233 lb (105.7 kg)     Health Maintenance Due  Topic Date Due   COVID-19 Vaccine (1) Never done   Pneumonia Vaccine 66+ Years old (1 - PCV) Never done   Zoster Vaccines- Shingrix (1 of 2) Never done   INFLUENZA VACCINE  Never done    There are no preventive care reminders to display for this patient.  Lab Results  Component Value Date   TSH 0.91 10/13/2020   Lab Results  Component Value Date   WBC 6.8 10/13/2020   HGB 14.2 10/13/2020   HCT 41.9 10/13/2020   MCV 85.7 10/13/2020   PLT 209.0 10/13/2020   Lab Results  Component Value Date   NA 140 10/13/2020   K 4.0 10/13/2020   CO2 26 10/13/2020   GLUCOSE 84 10/13/2020   BUN 12 10/13/2020   CREATININE 1.04 10/13/2020   BILITOT 0.5 10/13/2020   ALKPHOS 45 10/13/2020   AST 23 10/13/2020   ALT 22 10/13/2020   PROT 7.3 10/13/2020   ALBUMIN 4.6 10/13/2020   CALCIUM 9.6 10/13/2020   ANIONGAP 7 05/22/2016   GFR 75.71 10/13/2020   Lab Results  Component  Value Date   CHOL 202 (H) 10/13/2020   Lab Results  Component Value Date   HDL 42.50 10/13/2020   Lab Results  Component Value  Date   LDLCALC 142 (H) 10/13/2020   Lab Results  Component Value Date   TRIG 86.0 10/13/2020   Lab Results  Component Value Date   CHOLHDL 5 10/13/2020   Lab Results  Component Value Date   HGBA1C 5.6 10/13/2020      Assessment & Plan:   Problem List Items Addressed This Visit   None Visit Diagnoses     Dental infection    -  Primary       Meds ordered this encounter  Medications   DISCONTD: amoxicillin-clavulanate (AUGMENTIN) 875-125 MG tablet    Sig: Take 1 tablet by mouth 2 (two) times daily.    Dispense:  20 tablet    Refill:  0    Order Specific Question:   Supervising Provider    Answer:   Carlota Raspberry, JEFFREY R [[id number]]    Follow-up: No follow-ups on file.   PLAN Augmentin for infection as above Reviewed meds, no concerns, continue as rx Return as scheduled Patient encouraged to call clinic with any questions, comments, or concerns.  Maximiano Coss, NP

## 2021-03-28 ENCOUNTER — Ambulatory Visit: Payer: 59

## 2021-03-28 ENCOUNTER — Other Ambulatory Visit: Payer: Self-pay

## 2021-04-10 DIAGNOSIS — I10 Essential (primary) hypertension: Secondary | ICD-10-CM | POA: Diagnosis not present

## 2021-04-10 DIAGNOSIS — E782 Mixed hyperlipidemia: Secondary | ICD-10-CM | POA: Diagnosis not present

## 2021-04-10 DIAGNOSIS — Z789 Other specified health status: Secondary | ICD-10-CM | POA: Diagnosis not present

## 2021-04-24 ENCOUNTER — Ambulatory Visit
Admission: RE | Admit: 2021-04-24 | Discharge: 2021-04-24 | Disposition: A | Discharge status: Home / Self Care | Payer: 59 | Source: Ambulatory Visit | Attending: Internal Medicine | Admitting: Internal Medicine

## 2021-04-24 ENCOUNTER — Other Ambulatory Visit: Payer: Self-pay

## 2021-04-24 ENCOUNTER — Ambulatory Visit (INDEPENDENT_AMBULATORY_CARE_PROVIDER_SITE_OTHER): Payer: 59

## 2021-04-24 VITALS — BP 127/82 | HR 60 | Temp 98.1°F | Resp 18

## 2021-04-24 DIAGNOSIS — M25572 Pain in left ankle and joints of left foot: Secondary | ICD-10-CM | POA: Diagnosis not present

## 2021-04-24 DIAGNOSIS — M1A0720 Idiopathic chronic gout, left ankle and foot, without tophus (tophi): Secondary | ICD-10-CM

## 2021-04-24 MED ORDER — COLCHICINE 0.6 MG PO TABS: 1.2000 mg | ORAL_TABLET | Freq: Every day | ORAL | 0 refills | Status: DC

## 2021-04-24 NOTE — ED Provider Notes (Signed)
[facility]    CSN: [id number] Arrival date & time: 04/24/21  4097      History   Chief Complaint Chief Complaint  Patient presents with   Gout    HPI Thomas Ayala is a 65 y.o. male.   Patient presents with left ankle pain that started approximately 1 week ago.  Patient denies any known apparent injury.  Patient reports that he was eating, and pain started when he started walking after getting up from the table.  Denies any numbness or tingling.  Patient has not yet taken any medications to alleviate pain.  Patient reports that he does have history of gout and this "feels the same".  Denies any any high purine foods lately.    Past Medical History:  Diagnosis Date   Arthritis    ED (erectile dysfunction)    Gout    Hypertension    Prostate cancer [facility])     Patient Active Problem List   Diagnosis Date Noted   Sepsis ([place]) 05/21/2016   Lower urinary tract infectious disease 05/21/2016   Gout 08/07/2011   Essential hypertension 08/07/2011   ED (erectile dysfunction) 08/07/2011    History reviewed. No pertinent surgical history.     Home Medications    Prior to Admission medications   Medication Sig Start Date End Date Taking? Authorizing Provider  amLODipine-benazepril (LOTREL) 10-20 MG capsule Take 1 capsule by mouth daily. 05/14/16   [provider]  colchicine 0.6 MG tablet Take 2 tablets (1.2 mg total) by mouth daily. Take 1.2 mg (2 tablets), then 0.6 mg (1 tablet) 1 hour later 04/24/21  Yes Jelisa , Conway E, FNP  hydrochlorothiazide (HYDRODIURIL) 25 MG tablet TAKE 1 TABLET(25 MG) BY MOUTH DAILY 02/17/21   Maximiano Coss, NP  methocarbamol (ROBAXIN) 500 MG tablet Take 1 tablet (500 mg total) by mouth at bedtime as needed for muscle spasms. 10/13/20   Maximiano Coss, NP  sildenafil (REVATIO) 20 MG tablet Take by mouth. Patient not taking: Reported on 10/13/2020 06/29/19   [provider]    Family History Family History   Problem Relation Age of Onset   Dementia Mother    Lung cancer Father    Prostate cancer Brother     Social History Social History   Tobacco Use   Smoking status: Former    Types: Cigarettes    Quit date: 08/07/1991    Years since quitting: 29.7   Smokeless tobacco: Never  Substance Use Topics   Alcohol use: Yes     Allergies   Patient has no known allergies.   Review of Systems Review of Systems Per HPI  Physical Exam Triage Vital Signs ED Triage Vitals  Enc Vitals Group     BP 04/24/21 1008 127/82     Pulse Rate 04/24/21 1008 60     Resp 04/24/21 1008 18     Temp 04/24/21 1008 98.1 F (36.7 C)     Temp Source 04/24/21 1008 Oral     SpO2 04/24/21 1008 97 %     Weight --      Height --      Head Circumference --      Peak Flow --      Pain Score 04/24/21 1009 5     Pain Loc --      Pain Edu? --      Excl. in [place]? --    No data found.  Updated Vital Signs BP 127/82 (BP Location: Left Arm)  Pulse 60   Temp 98.1 F (36.7 C) (Oral)   Resp 18   SpO2 97%   Visual Acuity Right Eye Distance:   Left Eye Distance:   Bilateral Distance:    Right Eye Near:   Left Eye Near:    Bilateral Near:     Physical Exam Constitutional:      General: He is not in acute distress.    Appearance: Normal appearance. He is not toxic-appearing or diaphoretic.  HENT:     Head: Normocephalic and atraumatic.  Eyes:     Extraocular Movements: Extraocular movements intact.     Conjunctiva/sclera: Conjunctivae normal.  Pulmonary:     Effort: Pulmonary effort is normal.  Musculoskeletal:     Right ankle: Normal.     Left ankle: Swelling present. No deformity or ecchymosis. Tenderness present. Normal range of motion. Anterior drawer test negative. Normal pulse.     Comments: Moderate swelling and tenderness palpation to left ankle that extends slightly into left dorsal surface of foot.  Mild erythema noted as well.  No warmth to touch.  No abrasions, lacerations noted.   Neurovascular intact.  Neurological:     General: No focal deficit present.     Mental Status: He is alert and oriented to person, place, and time. Mental status is at baseline.  Psychiatric:        Mood and Affect: Mood normal.        Behavior: Behavior normal.        Thought Content: Thought content normal.        Judgment: Judgment normal.     UC Treatments / Results  Labs (all labs ordered are listed, but only abnormal results are displayed) Labs Reviewed - No data to display  EKG   Radiology DG Ankle Complete Left  Result Date: 04/24/2021 CLINICAL DATA:  Left foot and ankle pain EXAM: LEFT ANKLE COMPLETE - 3+ VIEW COMPARISON:  None. FINDINGS: Diffuse soft tissue swelling. Mild degenerative changes of the ankle joint. No acute osseous finding, fracture, or malalignment. Malleoli, talus and calcaneus intact. IMPRESSION: Soft tissue swelling and degenerative changes. No acute finding or malalignment. Electronically Signed   By: Jerilynn Mages.  Shick M.D.   On: 04/24/2021 10:55    Procedures Procedures (including critical care time)  Medications Ordered in UC Medications - No data to display  Initial Impression / Assessment and Plan / UC Course  I have reviewed the triage vital signs and the nursing notes.  Pertinent labs & imaging results that were available during my care of the patient were reviewed by me and considered in my medical decision making (see chart for details).     Left ankle x-ray was negative for any acute bony abnormality.  Physical exam is consistent with gout.  Will treat for gout with colchicine as patient reports that he has had more success with treatment with colchicine as opposed to prednisone.  Discussed NSAID treatment with patient as well but to limited due to patient's age.  Patient to follow-up with primary care or urgent care if symptoms persist.  Patient verbalized understanding and was agreeable with plan. Final Clinical Impressions(s) / UC Diagnoses    Final diagnoses:  Chronic gout of left ankle, unspecified cause     Discharge Instructions      Your x-ray was normal.  Suspect that you have gout.  This is being treated with colchicine.  Please follow-up with primary care or urgent care if symptoms persist.     ED  Prescriptions     Medication Sig Dispense Auth. Provider   colchicine 0.6 MG tablet Take 2 tablets (1.2 mg total) by mouth daily. Take 1.2 mg (2 tablets), then 0.6 mg (1 tablet) 1 hour later 3 tablet [place], Michele Rockers, [place]      PDMP not reviewed this encounter.   Teodora Medici, [place] 04/24/21 1109

## 2021-04-24 NOTE — Discharge Instructions (Signed)
Your x-ray was normal.  Suspect that you have gout.  This is being treated with colchicine.  Please follow-up with primary care or urgent care if symptoms persist.

## 2021-04-24 NOTE — ED Triage Notes (Signed)
Pt c/o left foot ankle pain, edema, erythema. States he has gout.   Onset Thursday. Asking for provider note for work.

## 2021-05-29 ENCOUNTER — Other Ambulatory Visit: Payer: Self-pay | Admitting: Registered Nurse

## 2021-05-29 DIAGNOSIS — I10 Essential (primary) hypertension: Secondary | ICD-10-CM

## 2021-07-28 ENCOUNTER — Ambulatory Visit: Payer: Medicare Other | Admitting: Registered Nurse

## 2021-07-30 ENCOUNTER — Other Ambulatory Visit: Payer: Self-pay

## 2021-07-30 ENCOUNTER — Encounter: Payer: Self-pay | Admitting: Registered Nurse

## 2021-07-30 ENCOUNTER — Ambulatory Visit (INDEPENDENT_AMBULATORY_CARE_PROVIDER_SITE_OTHER): Payer: Medicare HMO | Admitting: Registered Nurse

## 2021-07-30 VITALS — BP 132/85 | HR 79 | Temp 97.7°F | Resp 18 | Ht 68.0 in | Wt 229.6 lb

## 2021-07-30 DIAGNOSIS — J302 Other seasonal allergic rhinitis: Secondary | ICD-10-CM | POA: Diagnosis not present

## 2021-07-30 MED ORDER — MONTELUKAST SODIUM 10 MG PO TABS: 10.0000 mg | ORAL_TABLET | Freq: Every day | ORAL | 3 refills | Status: DC

## 2021-07-30 MED ORDER — AZELASTINE HCL 0.1 % NA SOLN: 1.0000 | Freq: Two times a day (BID) | NASAL | 12 refills | Status: DC

## 2021-07-30 MED ORDER — PREDNISONE 20 MG PO TABS: 20.0000 mg | ORAL_TABLET | Freq: Every day | ORAL | 0 refills | Status: DC

## 2021-07-30 MED ORDER — OXYMETAZOLINE HCL 0.05 % NA SOLN: 1.0000 | Freq: Two times a day (BID) | NASAL | 0 refills | Status: DC

## 2021-07-30 MED ORDER — CETIRIZINE HCL 10 MG PO TABS: 10.0000 mg | ORAL_TABLET | Freq: Every day | ORAL | 3 refills | Status: DC

## 2021-07-30 NOTE — Progress Notes (Signed)
? ?Established Patient Office Visit ? ?Subjective:  ?Patient ID: Thomas Ayala, male    DOB: April 12, 1956  Age: 66 y.o. MRN: [id number] ? ?CC:  ?Chief Complaint  ?Patient presents with  ? Epistaxis  ?  Patient states he was having some problems and had some blood in the morning while blowing his nose and also having some ringing in his ears   ? ? ?HPI ?Thomas Ayala presents for nosebleed, ear pressure. ? ?Ringing in ears on and off, blood in mucus when blowing his nose ?Hx of seasonal allergies.  ?No drainage from ears  ?Some sinus pressure ?No lower respiratory congestion ?No sick contacts ? ?Took some of his wife's montelukast with good relief.  ? ?Past Medical History:  ?Diagnosis Date  ? Arthritis   ? ED (erectile dysfunction)   ? Gout   ? Hypertension   ? Prostate cancer [facility])   ? ? ?History reviewed. No pertinent surgical history. ? ?Family History  ?Problem Relation Age of Onset  ? Dementia Mother   ? Lung cancer Father   ? Prostate cancer Brother   ? ? ?Social History  ? ?Socioeconomic History  ? Marital status: Single  ?  Spouse name: Not on file  ? Number of children: Not on file  ? Years of education: Not on file  ? Highest education level: Not on file  ?Occupational History  ? Not on file  ?Tobacco Use  ? Smoking status: Former  ?  Types: Cigarettes  ?  Quit date: 08/07/1991  ?  Years since quitting: 30.0  ? Smokeless tobacco: Never  ?Substance and Sexual Activity  ? Alcohol use: Yes  ? Drug use: Not on file  ? Sexual activity: Not on file  ?Other Topics Concern  ? Not on file  ?Social History Narrative  ? Not on file  ? ?Social Determinants of Health  ? ?Financial Resource Strain: Not on file  ?Food Insecurity: Not on file  ?Transportation Needs: Not on file  ?Physical Activity: Not on file  ?Stress: Not on file  ?Social Connections: Not on file  ?Intimate Partner Violence: Not on file  ? ? ?Outpatient Medications Prior to Visit  ?Medication Sig Dispense Refill  ? amLODipine-benazepril (LOTREL)  10-20 MG capsule Take 1 capsule by mouth daily.  4  ? colchicine 0.6 MG tablet Take 2 tablets (1.2 mg total) by mouth daily. Take 1.2 mg (2 tablets), then 0.6 mg (1 tablet) 1 hour later 3 tablet 0  ? hydrochlorothiazide (HYDRODIURIL) 25 MG tablet TAKE 1 TABLET(25 MG) BY MOUTH DAILY 90 tablet 0  ? methocarbamol (ROBAXIN) 500 MG tablet Take 1 tablet (500 mg total) by mouth at bedtime as needed for muscle spasms. 90 tablet 0  ? sildenafil (REVATIO) 20 MG tablet Take by mouth.    ? ?No facility-administered medications prior to visit.  ? ? ?No Known Allergies ? ?ROS ?Review of Systems  ?Constitutional: Negative.   ?HENT: Negative.    ?Eyes: Negative.   ?Respiratory: Negative.    ?Cardiovascular: Negative.   ?Gastrointestinal: Negative.   ?Genitourinary: Negative.   ?Musculoskeletal: Negative.   ?Skin: Negative.   ?Neurological: Negative.   ?Psychiatric/Behavioral: Negative.    ?All other systems reviewed and are negative. ? ?  ?Objective:  ?  ?Physical Exam ?Constitutional:   ?   General: He is not in acute distress. ?   Appearance: Normal appearance. He is normal weight. He is not ill-appearing, toxic-appearing or diaphoretic.  ?HENT:  ?  Right Ear: Ear canal and external ear normal. A middle ear effusion is present. There is no impacted cerumen.  ?   Left Ear: Ear canal and external ear normal. A middle ear effusion is present. There is no impacted cerumen.  ?   Ears:  ?   Comments: Clear fluid behind both TM. ?Cardiovascular:  ?   Rate and Rhythm: Normal rate and regular rhythm.  ?   Heart sounds: Normal heart sounds. No murmur heard. ?  No friction rub. No gallop.  ?Pulmonary:  ?   Effort: Pulmonary effort is normal. No respiratory distress.  ?   Breath sounds: Normal breath sounds. No stridor. No wheezing, rhonchi or rales.  ?Chest:  ?   Chest wall: No tenderness.  ?Neurological:  ?   General: No focal deficit present.  ?   Mental Status: He is alert and oriented to person, place, and time. Mental status is at  baseline.  ?Psychiatric:     ?   Mood and Affect: Mood normal.     ?   Behavior: Behavior normal.     ?   Thought Content: Thought content normal.     ?   Judgment: Judgment normal.  ? ? ?BP 132/85   Pulse 79   Temp 97.7 ?F (36.5 ?C) (Temporal)   Resp 18   Ht '5\' 8"'$  (1.727 m)   Wt 229 lb 9.6 oz (104.1 kg)   SpO2 99%   BMI 34.91 kg/m?  ?Wt Readings from Last 3 Encounters:  ?07/30/21 229 lb 9.6 oz (104.1 kg)  ?10/13/20 226 lb 3.2 oz (102.6 kg)  ?08/15/20 230 lb 3.2 oz (104.4 kg)  ? ? ? ?Health Maintenance Due  ?Topic Date Due  ? COVID-19 Vaccine (1) Never done  ? Zoster Vaccines- Shingrix (1 of 2) Never done  ? Pneumonia Vaccine 53+ Years old (1 - PCV) Never done  ? INFLUENZA VACCINE  Never done  ? ? ?There are no preventive care reminders to display for this patient. ? ?Lab Results  ?Component Value Date  ? TSH 0.91 10/13/2020  ? ?Lab Results  ?Component Value Date  ? WBC 6.8 10/13/2020  ? HGB 14.2 10/13/2020  ? HCT 41.9 10/13/2020  ? MCV 85.7 10/13/2020  ? PLT 209.0 10/13/2020  ? ?Lab Results  ?Component Value Date  ? NA 140 10/13/2020  ? K 4.0 10/13/2020  ? CO2 26 10/13/2020  ? GLUCOSE 84 10/13/2020  ? BUN 12 10/13/2020  ? CREATININE 1.04 10/13/2020  ? BILITOT 0.5 10/13/2020  ? ALKPHOS 45 10/13/2020  ? AST 23 10/13/2020  ? ALT 22 10/13/2020  ? PROT 7.3 10/13/2020  ? ALBUMIN 4.6 10/13/2020  ? CALCIUM 9.6 10/13/2020  ? ANIONGAP 7 05/22/2016  ? GFR 75.71 10/13/2020  ? ?Lab Results  ?Component Value Date  ? CHOL 202 (H) 10/13/2020  ? ?Lab Results  ?Component Value Date  ? HDL 42.50 10/13/2020  ? ?Lab Results  ?Component Value Date  ? LDLCALC 142 (H) 10/13/2020  ? ?Lab Results  ?Component Value Date  ? TRIG 86.0 10/13/2020  ? ?Lab Results  ?Component Value Date  ? CHOLHDL 5 10/13/2020  ? ?Lab Results  ?Component Value Date  ? HGBA1C 5.6 10/13/2020  ? ? ?  ?Assessment & Plan:  ? ?Problem List Items Addressed This Visit   ?None ?Visit Diagnoses   ? ? Seasonal allergies    -  Primary  ? Relevant Medications  ?  predniSONE (DELTASONE) 20 MG tablet  ?  montelukast (SINGULAIR) 10 MG tablet  ? azelastine (ASTELIN) 0.1 % nasal spray  ? oxymetazoline (AFRIN NASAL SPRAY) 0.05 % nasal spray  ? cetirizine (ZYRTEC) 10 MG tablet  ? ?  ? ? ?Meds ordered this encounter  ?Medications  ? predniSONE (DELTASONE) 20 MG tablet  ?  Sig: Take 1 tablet (20 mg total) by mouth daily with breakfast.  ?  Dispense:  5 tablet  ?  Refill:  0  ?  Order Specific Question:   Supervising Provider  ?  Answer:   Carlota Raspberry, JEFFREY R [[id number]]  ? montelukast (SINGULAIR) 10 MG tablet  ?  Sig: Take 1 tablet (10 mg total) by mouth at bedtime.  ?  Dispense:  90 tablet  ?  Refill:  3  ?  Order Specific Question:   Supervising Provider  ?  Answer:   Carlota Raspberry, JEFFREY R [[id number]]  ? azelastine (ASTELIN) 0.1 % nasal spray  ?  Sig: Place 1 spray into both nostrils 2 (two) times daily. Use in each nostril as directed  ?  Dispense:  30 mL  ?  Refill:  12  ?  Order Specific Question:   Supervising Provider  ?  Answer:   Carlota Raspberry, JEFFREY R [[id number]]  ? oxymetazoline (AFRIN NASAL SPRAY) 0.05 % nasal spray  ?  Sig: Place 1 spray into both nostrils 2 (two) times daily.  ?  Dispense:  30 mL  ?  Refill:  0  ?  Order Specific Question:   Supervising Provider  ?  Answer:   Carlota Raspberry, JEFFREY R [[id number]]  ? cetirizine (ZYRTEC) 10 MG tablet  ?  Sig: Take 1 tablet (10 mg total) by mouth daily.  ?  Dispense:  90 tablet  ?  Refill:  3  ?  Order Specific Question:   Supervising Provider  ?  Answer:   Carlota Raspberry, JEFFREY R [[id number]]  ? ? ?Follow-up: Return in about 3 months (around 10/30/2021) for After 10/13/21 for CPE and labs.  ? ?PLAN ?Start cetirizine and montelukast daily as above. ?Can use azelastine nasal spray daily prn. ?Oxymetalazone spray once daily until epistaxis resolved ?Return if worsening or failing to improve ?Patient encouraged to call clinic with any questions, comments, or concerns. ? ?Maximiano Coss, NP ?

## 2021-07-30 NOTE — Patient Instructions (Addendum)
Thomas Ayala ? ?Great to see you ? ?In brief: ? ?Cetirizine - daily antihistamine in the morning ?Montelukast - daily mast cell stabilizer/anti allergy in evening ?Azelastine - daily nasal spray, once each nostril in the morning or evening. ?Oxymetalazone - daily nasal spray, once daily if blood in mucus.  ?Prednisone - 1 tablet daily for five days to get ahead of allergies.  ? ? ?See you in May for a physical ? ?Thank you ? ?[name]  ? ? ? ?If you have lab work done today you will be contacted with your lab results within the next 2 weeks.  If you have not heard from [place] then please contact us. The fastest way to get your results is to register for My Chart. ? ? ?IF you received an x-ray today, you will receive an invoice from [facility]. Please contact [facility] at [phone] with questions or concerns regarding your invoice.  ? ?IF you received labwork today, you will receive an invoice from [place]. Please contact [facility] at [phone] with questions or concerns regarding your invoice.  ? ?Our billing staff will not be able to assist you with questions regarding bills from these companies. ? ?You will be contacted with the lab results as soon as they are available. The fastest way to get your results is to activate your My Chart account. Instructions are located on the last page of this paperwork. If you have not heard from [place] regarding the results in 2 weeks, please contact this office. ?  ? ? ?

## 2021-08-10 ENCOUNTER — Other Ambulatory Visit: Payer: Self-pay | Admitting: Registered Nurse

## 2021-08-10 DIAGNOSIS — I10 Essential (primary) hypertension: Secondary | ICD-10-CM

## 2021-08-25 ENCOUNTER — Ambulatory Visit
Admission: EM | Admit: 2021-08-25 | Discharge: 2021-08-25 | Disposition: A | Discharge status: Home / Self Care | Payer: Medicare HMO | Attending: Internal Medicine | Admitting: Internal Medicine

## 2021-08-25 ENCOUNTER — Encounter: Payer: Self-pay | Admitting: Emergency Medicine

## 2021-08-25 DIAGNOSIS — B349 Viral infection, unspecified: Secondary | ICD-10-CM

## 2021-08-25 LAB — POCT INFLUENZA A/B
Influenza A, POC: NEGATIVE
Influenza B, POC: NEGATIVE

## 2021-08-25 NOTE — Discharge Instructions (Signed)
It appears that you have a viral illness that should run its course and self resolve in the next few days.  Rapid flu was negative.  COVID test is pending.  We will call if it is positive. ?

## 2021-08-25 NOTE — ED Triage Notes (Signed)
Patient c/o generalized body aches, fatigue and fever x 1 day.  Patient has taken Tylenol about an hour ago. ?

## 2021-08-25 NOTE — ED Provider Notes (Signed)
?[place] ? ? ? ?CSN: [id number] ?Arrival date & time: 08/25/21  1445 ? ? ?  ? ?History   ?Chief Complaint ?Chief Complaint  ?Patient presents with  ? Fever  ? ? ?HPI ?Thomas Ayala is a 66 y.o. male.  ? ?Patient presents with generalized body aches, fatigue, fever that has been present for 1 day.  Tmax at home was 99.  Patient has taken Tylenol for symptoms.  Denies any associated upper respiratory symptoms, cough, sore throat, ear pain, chest pain, shortness of breath, nausea, vomiting, diarrhea, abdominal pain.  Denies any known sick contacts.  Denies any recent surgical procedures.  Denies urinary symptoms. ? ? ?Fever ? ?Past Medical History:  ?Diagnosis Date  ? Arthritis   ? ED (erectile dysfunction)   ? Gout   ? Hypertension   ? Prostate cancer ([place])   ? ? ?Patient Active Problem List  ? Diagnosis Date Noted  ? Sepsis ([place]) 05/21/2016  ? Lower urinary tract infectious disease 05/21/2016  ? Gout 08/07/2011  ? Essential hypertension 08/07/2011  ? ED (erectile dysfunction) 08/07/2011  ? ? ?History reviewed. No pertinent surgical history. ? ? ? ? ?Home Medications   ? ?Prior to Admission medications   ?Medication Sig Start Date End Date Taking? Authorizing Provider  ?amLODipine-benazepril (LOTREL) 10-20 MG capsule Take 1 capsule by mouth daily. 05/14/16  Yes [provider]  ?azelastine (ASTELIN) 0.1 % nasal spray Place 1 spray into both nostrils 2 (two) times daily. Use in each nostril as directed 07/30/21  Yes Maximiano Coss, NP  ?cetirizine (ZYRTEC) 10 MG tablet Take 1 tablet (10 mg total) by mouth daily. 07/30/21  Yes Maximiano Coss, NP  ?colchicine 0.6 MG tablet Take 2 tablets (1.2 mg total) by mouth daily. Take 1.2 mg (2 tablets), then 0.6 mg (1 tablet) 1 hour later 04/24/21  Yes Teodora Medici, FNP  ?hydrochlorothiazide (HYDRODIURIL) 25 MG tablet TAKE 1 TABLET(25 MG) BY MOUTH DAILY 08/10/21  Yes Maximiano Coss, NP  ?methocarbamol (ROBAXIN) 500 MG tablet Take 1 tablet (500 mg total) by  mouth at bedtime as needed for muscle spasms. 10/13/20  Yes Maximiano Coss, NP  ?montelukast (SINGULAIR) 10 MG tablet Take 1 tablet (10 mg total) by mouth at bedtime. 07/30/21  Yes Maximiano Coss, NP  ?oxymetazoline (AFRIN NASAL SPRAY) 0.05 % nasal spray Place 1 spray into both nostrils 2 (two) times daily. 07/30/21  Yes Maximiano Coss, NP  ?predniSONE (DELTASONE) 20 MG tablet Take 1 tablet (20 mg total) by mouth daily with breakfast. 07/30/21  Yes Maximiano Coss, NP  ?sildenafil (REVATIO) 20 MG tablet Take by mouth. 06/29/19  Yes [provider]  ? ? ?Family History ?Family History  ?Problem Relation Age of Onset  ? Dementia Mother   ? Lung cancer Father   ? Prostate cancer Brother   ? ? ?Social History ?Social History  ? ?Tobacco Use  ? Smoking status: Former  ?  Types: Cigarettes  ?  Quit date: 08/07/1991  ?  Years since quitting: 30.0  ? Smokeless tobacco: Never  ?Substance Use Topics  ? Alcohol use: Yes  ? ? ? ?Allergies   ?Patient has no known allergies. ? ? ?Review of Systems ?Review of Systems ?Per HPI ? ?Physical Exam ?Triage Vital Signs ?ED Triage Vitals  ?Enc Vitals Group  ?   BP 08/25/21 1543 127/87  ?   Pulse Rate 08/25/21 1543 93  ?   Resp 08/25/21 1543 18  ?   Temp 08/25/21 1543  100 ?F (37.8 ?C)  ?   Temp Source 08/25/21 1543 Oral  ?   SpO2 08/25/21 1543 97 %  ?   Weight 08/25/21 1544 225 lb (102.1 kg)  ?   Height 08/25/21 1544 '5\' 9"'$  (1.753 m)  ?   Head Circumference --   ?   Peak Flow --   ?   Pain Score 08/25/21 1543 0  ?   Pain Loc --   ?   Pain Edu? --   ?   Excl. in [place]? --   ? ?No data found. ? ?Updated Vital Signs ?BP 127/87 (BP Location: Right Arm)   Pulse 93   Temp 100 ?F (37.8 ?C) (Oral)   Resp 18   Ht '5\' 9"'$  (1.753 m)   Wt 225 lb (102.1 kg)   SpO2 97%   BMI 33.23 kg/m?  ? ?Visual Acuity ?Right Eye Distance:   ?Left Eye Distance:   ?Bilateral Distance:   ? ?Right Eye Near:   ?Left Eye Near:    ?Bilateral Near:    ? ?Physical Exam ?Constitutional:   ?   General: He is not in acute  distress. ?   Appearance: Normal appearance. He is not toxic-appearing or diaphoretic.  ?HENT:  ?   Head: Normocephalic and atraumatic.  ?   Right Ear: Tympanic membrane and ear canal normal.  ?   Left Ear: Tympanic membrane and ear canal normal.  ?   Nose: Nose normal.  ?   Mouth/Throat:  ?   Mouth: Mucous membranes are moist.  ?   Pharynx: No posterior oropharyngeal erythema.  ?Eyes:  ?   Extraocular Movements: Extraocular movements intact.  ?   Conjunctiva/sclera: Conjunctivae normal.  ?Cardiovascular:  ?   Rate and Rhythm: Normal rate and regular rhythm.  ?   Pulses: Normal pulses.  ?   Heart sounds: Normal heart sounds.  ?Pulmonary:  ?   Effort: Pulmonary effort is normal. No respiratory distress.  ?   Breath sounds: Normal breath sounds.  ?Abdominal:  ?   General: Abdomen is flat. Bowel sounds are normal. There is no distension.  ?   Palpations: Abdomen is soft.  ?   Tenderness: There is no abdominal tenderness.  ?Skin: ?   General: Skin is warm and dry.  ?Neurological:  ?   General: No focal deficit present.  ?   Mental Status: He is alert and oriented to person, place, and time. Mental status is at baseline.  ?Psychiatric:     ?   Mood and Affect: Mood normal.     ?   Behavior: Behavior normal.     ?   Thought Content: Thought content normal.     ?   Judgment: Judgment normal.  ? ? ? ?UC Treatments / Results  ?Labs ?(all labs ordered are listed, but only abnormal results are displayed) ?Labs Reviewed  ?NOVEL CORONAVIRUS, NAA  ?POCT INFLUENZA A/B  ? ? ?EKG ? ? ?Radiology ?No results found. ? ?Procedures ?Procedures (including critical care time) ? ?Medications Ordered in UC ?Medications - No data to display ? ?Initial Impression / Assessment and Plan / UC Course  ?I have reviewed the triage vital signs and the nursing notes. ? ?Pertinent labs & imaging results that were available during my care of the patient were reviewed by me and considered in my medical decision making (see chart for details). ? ?   ? ?Patient's symptoms appear viral in etiology.  Do not suspect any other  systemic illness.  Rapid flu was negative.  COVID test is pending.  Discussed supportive care and fever management with patient.  Discussed return precautions.  Patient verbalized understanding and was agreeable with plan. ?Final Clinical Impressions(s) / UC Diagnoses  ? ?Final diagnoses:  ?Viral illness  ? ? ? ?Discharge Instructions   ? ?  ?It appears that you have a viral illness that should run its course and self resolve in the next few days.  Rapid flu was negative.  COVID test is pending.  We will call if it is positive. ? ? ? ? ?ED Prescriptions   ?None ?  ? ?PDMP not reviewed this encounter. ?  ?Teodora Medici, [place] ?08/25/21 1637 ? ?

## 2021-08-26 LAB — NOVEL CORONAVIRUS, NAA: SARS-CoV-2, NAA: NOT DETECTED

## 2021-09-07 ENCOUNTER — Ambulatory Visit: Payer: Medicare HMO | Admitting: Registered Nurse

## 2021-09-11 ENCOUNTER — Encounter: Payer: Self-pay | Admitting: Registered Nurse

## 2021-09-11 ENCOUNTER — Ambulatory Visit (INDEPENDENT_AMBULATORY_CARE_PROVIDER_SITE_OTHER): Payer: Medicare HMO | Admitting: Registered Nurse

## 2021-09-11 ENCOUNTER — Other Ambulatory Visit: Payer: Self-pay

## 2021-09-11 VITALS — BP 126/84 | HR 70 | Temp 98.2°F | Resp 18 | Ht 69.0 in | Wt 223.4 lb

## 2021-09-11 DIAGNOSIS — N529 Male erectile dysfunction, unspecified: Secondary | ICD-10-CM | POA: Diagnosis not present

## 2021-09-11 DIAGNOSIS — F4010 Social phobia, unspecified: Secondary | ICD-10-CM | POA: Insufficient documentation

## 2021-09-11 DIAGNOSIS — F40248 Other situational type phobia: Secondary | ICD-10-CM | POA: Diagnosis not present

## 2021-09-11 MED ORDER — CLONAZEPAM 0.5 MG PO TBDP: 0.5000 mg | ORAL_TABLET | Freq: Two times a day (BID) | ORAL | 0 refills | Status: DC

## 2021-09-11 MED ORDER — TADALAFIL 20 MG PO TABS: 10.0000 mg | ORAL_TABLET | ORAL | 11 refills | Status: DC | PRN

## 2021-09-11 NOTE — Patient Instructions (Addendum)
Thomas Ayala, ? ?Dermatology: [phone] ?Give them a call when you can. ? ? ?Take a clonazepam during the evening in the coming days to see how it does. It should calm you down.  ? ?I have sent tadalafil refill ? ?See you in a few weeks for your physical ? ?Thanks, ? ?[name]  ? ? ?If you have lab work done today you will be contacted with your lab results within the next 2 weeks.  If you have not heard from [place] then please contact us. The fastest way to get your results is to register for My Chart. ? ? ?IF you received an x-ray today, you will receive an invoice from [facility]. Please contact [facility] at [phone] with questions or concerns regarding your invoice.  ? ?IF you received labwork today, you will receive an invoice from [facility]. Please contact [facility] at [phone] with questions or concerns regarding your invoice.  ? ?Our billing staff will not be able to assist you with questions regarding bills from these companies. ? ?You will be contacted with the lab results as soon as they are available. The fastest way to get your results is to activate your My Chart account. Instructions are located on the last page of this paperwork. If you have not heard from [place] regarding the results in 2 weeks, please contact this office. ?  ? ? ?

## 2021-09-11 NOTE — Assessment & Plan Note (Signed)
Given a short course of clonazepam. Discussed risks and safe use. Pt voices understanding. He understands that this is not a daily medication and is in no way intended for frequent or long term use. ?

## 2021-09-11 NOTE — Assessment & Plan Note (Signed)
Refill tadalafil ?

## 2021-09-11 NOTE — Progress Notes (Signed)
? ?Established Patient Office Visit ? ?Subjective:  ?Patient ID: Thomas Ayala, male    DOB: Oct 13, 1955  Age: 66 y.o. MRN: [id number] ? ?CC:  ?Chief Complaint  ?Patient presents with  ? Anxiety  ?  Patient states he would like to discuss getting something for his nerves because he has to do a speech and feels he will get too nervous.  ? ? ?HPI ?Thomas Ayala presents for anxiety, ED ? ?Anxiety ?Situational. 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 ?He does have some anxiety about this. ?He does not endorse frequent daily anxiety. He has had one panic attack, around 15 years ago, with a specific trigger of seeing a past romantic partner.  ?He had been on valium in the remote past prn, but does not remember much of taking this. ? ?ED ?Has been on both sildenafil and tadalafil in the past. ?Did well with tadalafil. No AE. ?Would like to have this refilled. ?No pain with erections, hematuria, hematospermia, LUTS.  ? ? ?Outpatient Medications Prior to Visit  ?Medication Sig Dispense Refill  ? amLODipine-benazepril (LOTREL) 10-20 MG capsule Take 1 capsule by mouth daily.  4  ? azelastine (ASTELIN) 0.1 % nasal spray Place 1 spray into both nostrils 2 (two) times daily. Use in each nostril as directed 30 mL 12  ? cetirizine (ZYRTEC) 10 MG tablet Take 1 tablet (10 mg total) by mouth daily. 90 tablet 3  ? colchicine 0.6 MG tablet Take 2 tablets (1.2 mg total) by mouth daily. Take 1.2 mg (2 tablets), then 0.6 mg (1 tablet) 1 hour later 3 tablet 0  ? hydrochlorothiazide (HYDRODIURIL) 25 MG tablet TAKE 1 TABLET(25 MG) BY MOUTH DAILY 90 tablet 0  ? methocarbamol (ROBAXIN) 500 MG tablet Take 1 tablet (500 mg total) by mouth at bedtime as needed for muscle spasms. 90 tablet 0  ? montelukast (SINGULAIR) 10 MG tablet Take 1 tablet (10 mg total) by mouth at bedtime. 90 tablet 3  ? oxymetazoline (AFRIN NASAL SPRAY) 0.05 % nasal spray Place 1 spray into both nostrils 2  (two) times daily. 30 mL 0  ? predniSONE (DELTASONE) 20 MG tablet Take 1 tablet (20 mg total) by mouth daily with breakfast. 5 tablet 0  ? sildenafil (REVATIO) 20 MG tablet Take by mouth.    ? ?No facility-administered medications prior to visit.  ? ? ?Review of Systems  ?Constitutional: Negative.   ?HENT: Negative.    ?Eyes: Negative.   ?Respiratory: Negative.    ?Cardiovascular: Negative.   ?Gastrointestinal: Negative.   ?Genitourinary: Negative.   ?Musculoskeletal: Negative.   ?Skin: Negative.   ?Neurological: Negative.   ?Psychiatric/Behavioral: Negative.    ?All other systems reviewed and are negative. ? ?  ?Objective:  ?  ? ?BP 126/84   Pulse 70   Temp 98.2 ?F (36.8 ?C) (Temporal)   Resp 18   Ht '5\' 9"'$  (1.753 m)   Wt 223 lb 6.4 oz (101.3 kg)   SpO2 97%   BMI 32.99 kg/m?  ? ?Wt Readings from Last 3 Encounters:  ?09/11/21 223 lb 6.4 oz (101.3 kg)  ?08/25/21 225 lb (102.1 kg)  ?07/30/21 229 lb 9.6 oz (104.1 kg)  ? ?Physical Exam ?Constitutional:   ?   General: He is not in acute distress. ?   Appearance: Normal appearance. He is normal weight. He is not ill-appearing, toxic-appearing or diaphoretic.  ?Cardiovascular:  ?   Rate and Rhythm:  Normal rate and regular rhythm.  ?   Heart sounds: Normal heart sounds. No murmur heard. ?  No friction rub. No gallop.  ?Pulmonary:  ?   Effort: Pulmonary effort is normal. No respiratory distress.  ?   Breath sounds: Normal breath sounds. No stridor. No wheezing, rhonchi or rales.  ?Chest:  ?   Chest wall: No tenderness.  ?Neurological:  ?   General: No focal deficit present.  ?   Mental Status: He is alert and oriented to person, place, and time. Mental status is at baseline.  ?Psychiatric:     ?   Mood and Affect: Mood normal.     ?   Behavior: Behavior normal.     ?   Thought Content: Thought content normal.     ?   Judgment: Judgment normal.  ? ? ?No results found for any visits on 09/11/21. ? ? ? ?The 10-year ASCVD risk score (Arnett DK, et al., 2019) is: 16.4% ? ?   ?Assessment & Plan:  ? ?Problem List Items Addressed This Visit   ? ?  ? Other  ? ED (erectile dysfunction) - Primary  ?  Refill tadalafil ? ?  ?  ? Relevant Medications  ? tadalafil (CIALIS) 20 MG tablet  ? Fear of public speaking  ?  Given a short course of clonazepam. Discussed risks and safe use. Pt voices understanding. He understands that this is not a daily medication and is in no way intended for frequent or long term use. ? ?  ?  ? Relevant Medications  ? clonazePAM (KLONOPIN) 0.5 MG disintegrating tablet  ? ? ?Meds ordered this encounter  ?Medications  ? clonazePAM (KLONOPIN) 0.5 MG disintegrating tablet  ?  Sig: Take 1 tablet (0.5 mg total) by mouth 2 (two) times daily.  ?  Dispense:  15 tablet  ?  Refill:  0  ?  Order Specific Question:   Supervising Provider  ?  Answer:   Carlota Raspberry, JEFFREY R [[id number]]  ? tadalafil (CIALIS) 20 MG tablet  ?  Sig: Take 0.5-1 tablets (10-20 mg total) by mouth every other day as needed for erectile dysfunction.  ?  Dispense:  30 tablet  ?  Refill:  11  ?  Order Specific Question:   Supervising Provider  ?  Answer:   Carlota Raspberry, JEFFREY R [[id number]]  ? ? ?No follow-ups on file.  ? ? ?Maximiano Coss, NP ?

## 2021-09-17 ENCOUNTER — Telehealth: Payer: Self-pay

## 2021-09-17 NOTE — Telephone Encounter (Signed)
Patient is calling in stating that he would like for tadalafil (CIALIS) 20 MG tablet sent to [facility].  ?

## 2021-09-18 ENCOUNTER — Other Ambulatory Visit: Payer: Self-pay

## 2021-09-18 DIAGNOSIS — N529 Male erectile dysfunction, unspecified: Secondary | ICD-10-CM

## 2021-09-18 MED ORDER — TADALAFIL 20 MG PO TABS: 10.0000 mg | ORAL_TABLET | ORAL | 11 refills | Status: DC | PRN

## 2021-09-18 NOTE — Telephone Encounter (Signed)
Sent -recent refill on 09/11/21 ?

## 2021-10-15 ENCOUNTER — Encounter: Payer: Medicare HMO | Admitting: Registered Nurse

## 2021-10-28 ENCOUNTER — Encounter: Payer: Medicare HMO | Admitting: Registered Nurse

## 2021-11-30 ENCOUNTER — Other Ambulatory Visit: Payer: Self-pay

## 2021-11-30 DIAGNOSIS — I10 Essential (primary) hypertension: Secondary | ICD-10-CM

## 2021-11-30 MED ORDER — HYDROCHLOROTHIAZIDE 25 MG PO TABS: ORAL_TABLET | ORAL | 1 refills | Status: DC

## 2021-12-01 ENCOUNTER — Encounter: Payer: Medicare HMO | Admitting: Registered Nurse

## 2021-12-02 ENCOUNTER — Other Ambulatory Visit: Payer: Self-pay

## 2021-12-02 ENCOUNTER — Telehealth: Payer: Self-pay | Admitting: Registered Nurse

## 2021-12-02 MED ORDER — AMLODIPINE BESY-BENAZEPRIL HCL 10-20 MG PO CAPS: 1.0000 | ORAL_CAPSULE | Freq: Every day | ORAL | 0 refills | Status: DC

## 2021-12-02 NOTE — Telephone Encounter (Signed)
Pt has a appt on 12/14/21. Pt states that his pharmacy will not refill his medication amlodipine-benazepril 10-20 mg. Pt only has three pill left.  Pt pharmacy is [facility] on [address]   I am not sure what going on bc I see four refills for this medication. Please advise

## 2021-12-14 ENCOUNTER — Encounter: Payer: Self-pay | Admitting: Registered Nurse

## 2021-12-14 ENCOUNTER — Other Ambulatory Visit: Payer: Self-pay

## 2021-12-14 ENCOUNTER — Ambulatory Visit (INDEPENDENT_AMBULATORY_CARE_PROVIDER_SITE_OTHER): Payer: Medicare HMO | Admitting: Registered Nurse

## 2021-12-14 VITALS — BP 132/82 | HR 68 | Temp 98.2°F | Resp 18 | Ht 69.0 in | Wt 220.0 lb

## 2021-12-14 DIAGNOSIS — I10 Essential (primary) hypertension: Secondary | ICD-10-CM | POA: Diagnosis not present

## 2021-12-14 DIAGNOSIS — Z0001 Encounter for general adult medical examination with abnormal findings: Secondary | ICD-10-CM | POA: Diagnosis not present

## 2021-12-14 DIAGNOSIS — R2233 Localized swelling, mass and lump, upper limb, bilateral: Secondary | ICD-10-CM | POA: Diagnosis not present

## 2021-12-14 DIAGNOSIS — R972 Elevated prostate specific antigen [PSA]: Secondary | ICD-10-CM

## 2021-12-14 LAB — CBC WITH DIFFERENTIAL/PLATELET
Basophils Absolute: 0 10*3/uL (ref 0.0–0.1)
Basophils Relative: 0.6 % (ref 0.0–3.0)
Eosinophils Absolute: 0.1 10*3/uL (ref 0.0–0.7)
Eosinophils Relative: 1.4 % (ref 0.0–5.0)
HCT: 41.9 % (ref 39.0–52.0)
Hemoglobin: 14 g/dL (ref 13.0–17.0)
Lymphocytes Relative: 40.7 % (ref 12.0–46.0)
Lymphs Abs: 2.8 10*3/uL (ref 0.7–4.0)
MCHC: 33.5 g/dL (ref 30.0–36.0)
MCV: 85.6 fl (ref 78.0–100.0)
Monocytes Absolute: 0.5 10*3/uL (ref 0.1–1.0)
Monocytes Relative: 7.5 % (ref 3.0–12.0)
Neutro Abs: 3.4 10*3/uL (ref 1.4–7.7)
Neutrophils Relative %: 49.8 % (ref 43.0–77.0)
Platelets: 194 10*3/uL (ref 150.0–400.0)
RBC: 4.89 Mil/uL (ref 4.22–5.81)
RDW: 14.4 % (ref 11.5–15.5)
WBC: 6.8 10*3/uL (ref 4.0–10.5)

## 2021-12-14 LAB — LIPID PANEL
Cholesterol: 179 mg/dL (ref 0–200)
HDL: 42.6 mg/dL (ref >39.00)
LDL Cholesterol: 124 mg/dL — ABNORMAL HIGH (ref 0–99)
NonHDL: 136.68
Total CHOL/HDL Ratio: 4
Triglycerides: 63 mg/dL (ref 0.0–149.0)
VLDL: 12.6 mg/dL (ref 0.0–40.0)

## 2021-12-14 LAB — COMPREHENSIVE METABOLIC PANEL
ALT: 19 U/L (ref 0–53)
AST: 18 U/L (ref 0–37)
Albumin: 4.7 g/dL (ref 3.5–5.2)
Alkaline Phosphatase: 47 U/L (ref 39–117)
BUN: 10 mg/dL (ref 6–23)
CO2: 28 mEq/L (ref 19–32)
Calcium: 9.7 mg/dL (ref 8.4–10.5)
Chloride: 105 mEq/L (ref 96–112)
Creatinine, Ser: 0.96 mg/dL (ref 0.40–1.50)
GFR: 82.67 mL/min (ref >60.00)
Glucose, Bld: 87 mg/dL (ref 70–99)
Potassium: 4.3 mEq/L (ref 3.5–5.1)
Sodium: 140 mEq/L (ref 135–145)
Total Bilirubin: 0.6 mg/dL (ref 0.2–1.2)
Total Protein: 7.3 g/dL (ref 6.0–8.3)

## 2021-12-14 LAB — PSA, MEDICARE: PSA: 13.54 ng/ml — ABNORMAL HIGH (ref 0.10–4.00)

## 2021-12-14 LAB — HEMOGLOBIN A1C: Hgb A1c MFr Bld: 5.6 % (ref 4.6–6.5)

## 2021-12-14 LAB — MAGNESIUM: Magnesium: 1.9 mg/dL (ref 1.5–2.5)

## 2021-12-14 LAB — TSH: TSH: 0.9 u[IU]/mL (ref 0.35–5.50)

## 2021-12-14 MED ORDER — LOSARTAN POTASSIUM 100 MG PO TABS: 100.0000 mg | ORAL_TABLET | Freq: Every day | ORAL | 1 refills | Status: DC

## 2021-12-14 NOTE — Progress Notes (Signed)
Complete physical exam  Patient: Thomas Ayala   DOB: Dec 23, 1955   66 y.o. Male  MRN: [id number] Visit Date: 12/14/2021  Subjective:    Chief Complaint  Patient presents with   Annual Exam    Patient states he is here for an annual exam. And also discuss a referral    Thomas Ayala is a 66 y.o. male who presents today for a complete physical exam. He reports consuming a general diet.     He generally feels well. He reports sleeping well. He does have additional problems to discuss today.   Vision:Within the last year Dental:Within Last 6 months STD Screen:No PSA:Yes  Hypertension: Patient Currently taking: amlodipine-benazepril 10-'20mg'$  po qd, hctz '25mg'$  po qd Good effect.   Denies CV symptoms including: chest pain, shob, doe, headache, visual changes, fatigue, claudication, and dependent edema.   Previous readings and labs: BP Readings from Last 3 Encounters:  12/14/21 132/82  09/11/21 126/84  08/25/21 127/87   Lab Results  Component Value Date   CREATININE 1.04 10/13/2020    Skin lump R elbow, dorsal side. Painless. Firm Has not changed much in past few years.   Most recent fall risk assessment:    07/30/2021    1:24 PM  [place] in the past year? 0  Number falls in past yr: 0  Injury with Fall? 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 No  pertinent surgical history. Social History   Tobacco Use   Smoking status: Former    Types: Cigarettes    Quit date: 08/07/1991    Years since quitting: 30.3   Smokeless tobacco: Never  Substance Use Topics   Alcohol use: Yes   Social History   Socioeconomic History   Marital status: Single    Spouse name: Not on file   Number of children: Not on file   Years of education: Not on file   Highest education level: Not on file  Occupational History   Not on file  Tobacco Use   Smoking status: Former    Types: Cigarettes    Quit date: 08/07/1991    Years since quitting: 30.3   Smokeless tobacco: Never  Substance and Sexual Activity   Alcohol use: Yes   Drug use: Not on file   Sexual activity: Not on file  Other Topics Concern   Not on file  Social History Narrative   Not on file   Social Determinants of Health   Financial Resource Strain: Not on file  Food Insecurity: Not on file  Transportation Needs: Not on file  Physical Activity: Not on file  Stress: Not on file  Social Connections: Not on file  Intimate Partner Violence: Not  on file   Family Status  Relation Name Status   Mother  Deceased   Father  Deceased   Brother  (Not Specified)   Family History  Problem Relation Age of Onset   Dementia Mother    Lung cancer Father    Prostate cancer Brother    No Known Allergies   Patient Care Team: Maximiano Coss, NP as PCP - General (Adult Health Nurse Practitioner)   Medications: Outpatient Medications Prior to Visit  Medication Sig   azelastine (ASTELIN) 0.1 % nasal spray Place 1 spray into both nostrils 2 (two) times daily. Use in each nostril as directed   cetirizine (ZYRTEC) 10 MG tablet Take 1 tablet (10 mg total) by mouth daily.   clonazePAM (KLONOPIN) 0.5 MG disintegrating tablet Take 1 tablet (0.5 mg total) by mouth 2 (two) times daily.   colchicine 0.6 MG tablet Take 2 tablets (1.2 mg total) by mouth daily. Take 1.2 mg (2 tablets), then 0.6 mg (1  tablet) 1 hour later   hydrochlorothiazide (HYDRODIURIL) 25 MG tablet TAKE 1 TABLET(25 MG) BY MOUTH DAILY   methocarbamol (ROBAXIN) 500 MG tablet Take 1 tablet (500 mg total) by mouth at bedtime as needed for muscle spasms.   montelukast (SINGULAIR) 10 MG tablet Take 1 tablet (10 mg total) by mouth at bedtime.   oxymetazoline (AFRIN NASAL SPRAY) 0.05 % nasal spray Place 1 spray into both nostrils 2 (two) times daily.   predniSONE (DELTASONE) 20 MG tablet Take 1 tablet (20 mg total) by mouth daily with breakfast.   tadalafil (CIALIS) 20 MG tablet Take 0.5-1 tablets (10-20 mg total) by mouth every other day as needed for erectile dysfunction.   [DISCONTINUED] amLODipine-benazepril (LOTREL) 10-20 MG capsule Take 1 capsule by mouth daily.   No facility-administered medications prior to visit.    Review of Systems  Constitutional: Negative.   HENT: Negative.    Eyes: Negative.   Respiratory: Negative.    Cardiovascular: Negative.   Gastrointestinal: Negative.   Genitourinary: Negative.   Musculoskeletal: Negative.   Skin: Negative.   Neurological: Negative.   Psychiatric/Behavioral: Negative.    All other systems reviewed and are negative.   Last CBC Lab Results  Component Value Date   WBC 6.8 10/13/2020   HGB 14.2 10/13/2020   HCT 41.9 10/13/2020   MCV 85.7 10/13/2020   MCH 28.9 07/27/2019   RDW 14.3 10/13/2020   PLT 209.0 93/57/0177   Last metabolic panel Lab Results  Component Value Date   GLUCOSE 84 10/13/2020   NA 140 10/13/2020   K 4.0 10/13/2020   CL 104 10/13/2020   CO2 26 10/13/2020   BUN 12 10/13/2020   CREATININE 1.04 10/13/2020   GFRNONAA >60 05/22/2016   CALCIUM 9.6 10/13/2020   PROT 7.3 10/13/2020   ALBUMIN 4.6 10/13/2020   BILITOT 0.5 10/13/2020   ALKPHOS 45 10/13/2020   AST 23 10/13/2020   ALT 22 10/13/2020   ANIONGAP 7 05/22/2016   Last lipids Lab Results  Component Value Date   CHOL 202 (H) 10/13/2020   HDL 42.50 10/13/2020   LDLCALC 142 (H)  10/13/2020   TRIG 86.0 10/13/2020   CHOLHDL 5 10/13/2020   Last hemoglobin A1c Lab Results  Component Value Date   HGBA1C 5.6 10/13/2020   Last thyroid functions Lab Results  Component Value Date   TSH 0.91 10/13/2020   Last vitamin D Lab Results  Component Value Date   VD25OH 35.89 10/13/2020   Last vitamin B12 and Folate  No results found for: "VITAMINB12", "FOLATE"      Objective:     BP 132/82   Pulse 68   Temp 98.2 F (36.8 C) (Temporal)   Resp 18   Ht '5\' 9"'$  (1.753 m)   Wt 220 lb (99.8 kg)   SpO2 98%   BMI 32.49 kg/m   BP Readings from Last 3 Encounters:  12/14/21 132/82  09/11/21 126/84  08/25/21 127/87   Wt Readings from Last 3 Encounters:  12/14/21 220 lb (99.8 kg)  09/11/21 223 lb 6.4 oz (101.3 kg)  08/25/21 225 lb (102.1 kg)   SpO2 Readings from Last 3 Encounters:  12/14/21 98%  09/11/21 97%  08/25/21 97%      Physical Exam Vitals and nursing note reviewed.  Constitutional:      General: He is not in acute distress.    Appearance: Normal appearance. He is not ill-appearing, toxic-appearing or diaphoretic.  HENT:     Head: Normocephalic and atraumatic.     Right Ear: Tympanic membrane, ear canal and external ear normal. There is no impacted cerumen.     Left Ear: Tympanic membrane, ear canal and external ear normal. There is no impacted cerumen.     Nose: Nose normal. No congestion or rhinorrhea.     Mouth/Throat:     Mouth: Mucous membranes are moist.     Pharynx: Oropharynx is clear. No oropharyngeal exudate or posterior oropharyngeal erythema.  Eyes:     General: No scleral icterus.       Right eye: No discharge.        Left eye: No discharge.     Extraocular Movements: Extraocular movements intact.     Conjunctiva/sclera: Conjunctivae normal.     Pupils: Pupils are equal, round, and reactive to light.  Neck:     Vascular: No carotid bruit.  Cardiovascular:     Rate and Rhythm: Normal rate and regular rhythm.     Pulses: Normal  pulses.     Heart sounds: Normal heart sounds. No murmur heard.    No friction rub. No gallop.  Pulmonary:     Effort: Pulmonary effort is normal. No respiratory distress.     Breath sounds: Normal breath sounds. No stridor. No wheezing, rhonchi or rales.  Chest:     Chest wall: No tenderness.  Abdominal:     General: Abdomen is flat. Bowel sounds are normal. There is no distension.     Palpations: Abdomen is soft. There is no mass.     Tenderness: There is no abdominal tenderness. There is no right CVA tenderness, left CVA tenderness, guarding or rebound.     Hernia: No hernia is present.  Musculoskeletal:        General: No swelling, tenderness, deformity or signs of injury. Normal range of motion.     Cervical back: Normal range of motion and neck supple. No rigidity or tenderness.     Right lower leg: No edema.     Left lower leg: No edema.  Lymphadenopathy:     Cervical: No cervical adenopathy.  Skin:    General: Skin is warm and dry.     Capillary Refill: Capillary refill takes less than 2 seconds.     Coloration: Skin is not jaundiced or pale.     Findings: No bruising, erythema, lesion or rash.  Neurological:     General: No focal deficit present.     Mental Status: He is alert and oriented to person, place, and time. Mental  status is at baseline.     Cranial Nerves: No cranial nerve deficit.     Sensory: No sensory deficit.     Motor: No weakness.     Coordination: Coordination normal.     Gait: Gait normal.     Deep Tendon Reflexes: Reflexes normal.  Psychiatric:        Mood and Affect: Mood normal.        Behavior: Behavior normal.        Thought Content: Thought content normal.        Judgment: Judgment normal.      No results found for any visits on 12/14/21.    Assessment & Plan:    Routine Health Maintenance and Physical Exam   There is no immunization history on file for this patient.  Health Maintenance  Topic Date Due   COVID-19 Vaccine (1) Never  done   Hepatitis C Screening  Never done   Zoster Vaccines- Shingrix (1 of 2) Never done   COLONOSCOPY (Pts 45-44yr Insurance coverage will need to be confirmed)  Never done   Pneumonia Vaccine 66 Years old (1 - PCV) 09/12/2022 (Originally 12/17/2020)   INFLUENZA VACCINE  12/22/2021   TETANUS/TDAP  05/04/2024   HIV Screening  Completed   HPV VACCINES  Aged Out    Discussed health benefits of physical activity, and encouraged him to engage in regular exercise appropriate for his age and condition.  Problem List Items Addressed This Visit       Cardiovascular and Mediastinum   Essential hypertension   Relevant Medications   losartan (COZAAR) 100 MG tablet   Other Relevant Orders   CBC with Differential/Platelet   Comprehensive metabolic panel   Hemoglobin A1c   Lipid panel   TSH   PSA, Medicare ( [place] only)   Magnesium   Other Visit Diagnoses     Mass of both elbows    -  Primary   Relevant Orders   Ambulatory referral to Dermatology   Elevated PSA, between 10 and less than 20 ng/ml       Relevant Orders   PSA, Medicare ( [place] only)      Return in about 1 year (around 12/15/2022) for CPE and labs.     PLAN Exam unremarkable.  Change antihypertensive to losartan '100mg'$  with hctz '25mg'$  Replace referral to derm.  Labs collected. Will follow up with the patient as warranted. Patient encouraged to call clinic with any questions, comments, or concerns.  RMaximiano Coss NP

## 2021-12-14 NOTE — Patient Instructions (Addendum)
Mr. Boger -   Doristine Devoid to see you!  I recommend these providers as PCP: Inda Coke, PA Dimas Chyle, MD Agustina Caroli, MD Myrna Blazer Early, NP Jeralyn Ruths, DNP Berniece Pap, MD  I'll call with lab results, let me know if you need anything,  Thank you for letting me take part in your care,  Rich   If you have lab work done today you will be contacted with your lab results within the next 2 weeks.  If you have not heard from [place] then please contact us. The fastest way to get your results is to register for My Chart.   IF you received an x-ray today, you will receive an invoice from [facility]. Please contact [facility] at [phone] with questions or concerns regarding your invoice.   IF you received labwork today, you will receive an invoice from [place]. Please contact [facility] at [phone] with questions or concerns regarding your invoice.   Our billing staff will not be able to assist you with questions regarding bills from these companies.  You will be contacted with the lab results as soon as they are available. The fastest way to get your results is to activate your My Chart account. Instructions are located on the last page of this paperwork. If you have not heard from [place] regarding the results in 2 weeks, please contact this office.

## 2021-12-18 ENCOUNTER — Telehealth: Payer: Self-pay | Admitting: Registered Nurse

## 2021-12-18 NOTE — Telephone Encounter (Signed)
Caller name: Mccall (pt)   On DPR? :yes/no: Yes  Call back number: [phone]  Provider they see: Maximiano Coss  Reason for call: Pt calling to find out about referral for the knot on his elbow. Pt also wondering if Richard saw anything going on with his eyes since he asked about his last eye exam.

## 2021-12-18 NOTE — Telephone Encounter (Signed)
Spoke w/ pt and advised him that the referral Dermatologist referral has been placed but it may take awhile to get in and that we obtain his eye exam reports from his OD for his health maintenance  . Pt expressed verbal understanding

## 2021-12-22 DIAGNOSIS — C61 Malignant neoplasm of prostate: Secondary | ICD-10-CM | POA: Diagnosis not present

## 2021-12-22 DIAGNOSIS — Z91199 Patient's noncompliance with other medical treatment and regimen due to unspecified reason: Secondary | ICD-10-CM | POA: Diagnosis not present

## 2022-01-06 DIAGNOSIS — C61 Malignant neoplasm of prostate: Secondary | ICD-10-CM | POA: Diagnosis not present

## 2022-01-06 DIAGNOSIS — Z191 Hormone sensitive malignancy status: Secondary | ICD-10-CM | POA: Diagnosis not present

## 2022-01-22 IMAGING — DX DG ANKLE COMPLETE 3+V*L*
3 series · 3 of 3 positions shown · non-contrast
Comparison: None.

CLINICAL DATA: Left foot and ankle pain

EXAM:
LEFT ANKLE COMPLETE - 3+ VIEW

[ankle ap]
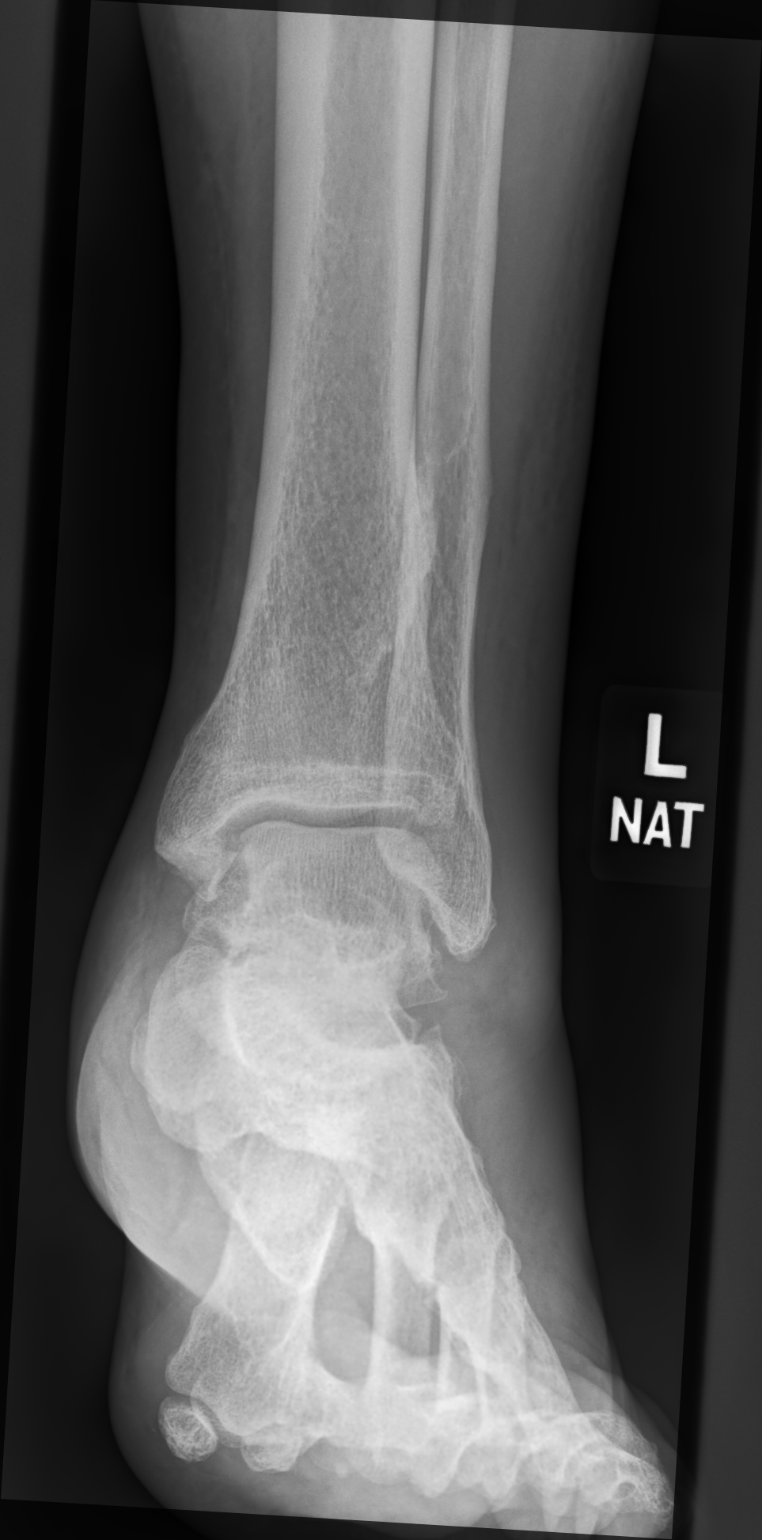

[ankle medial oblique]
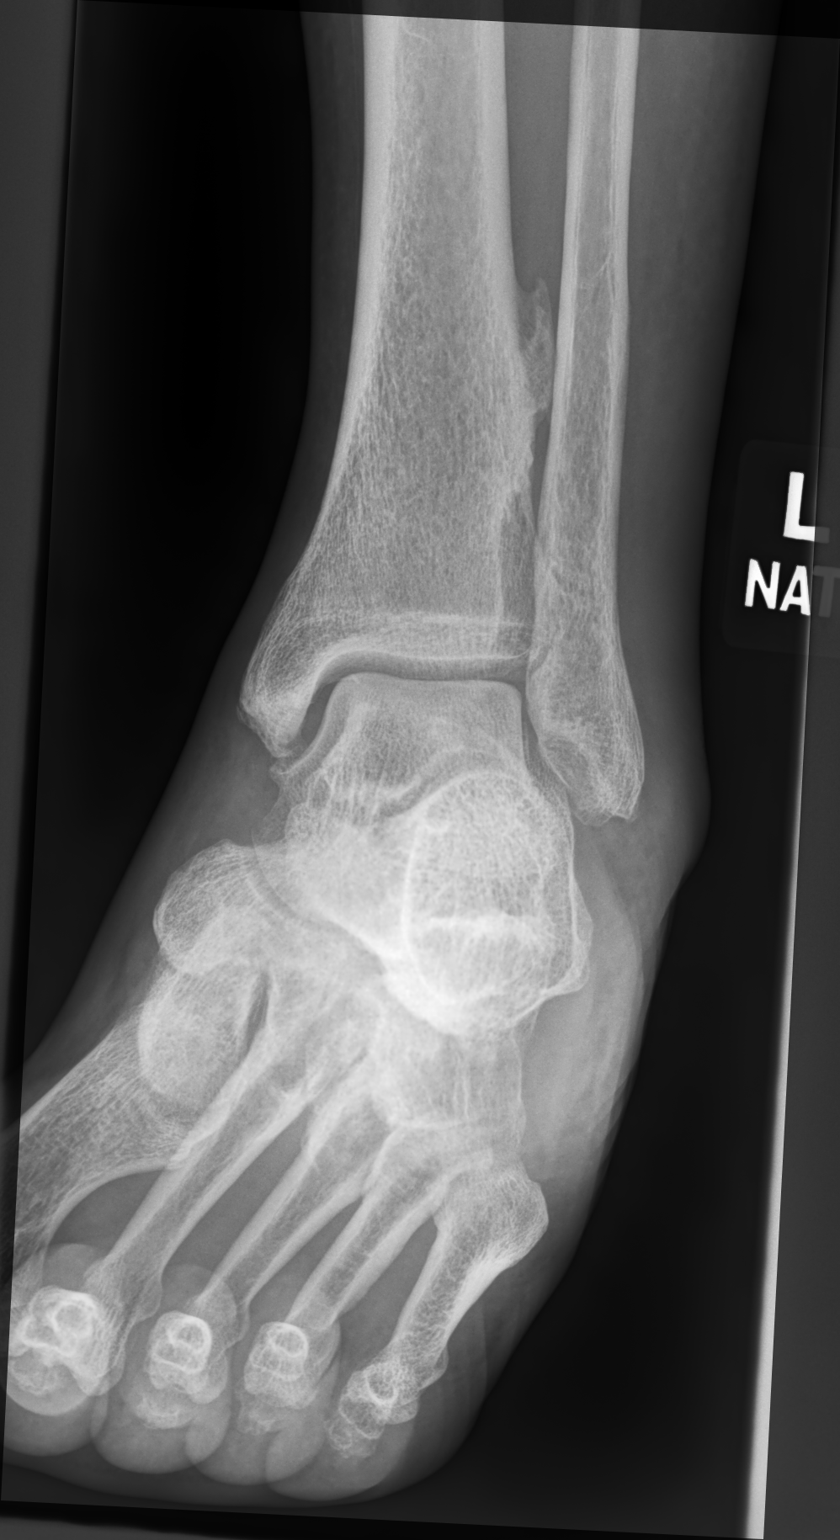

[ankle lat]
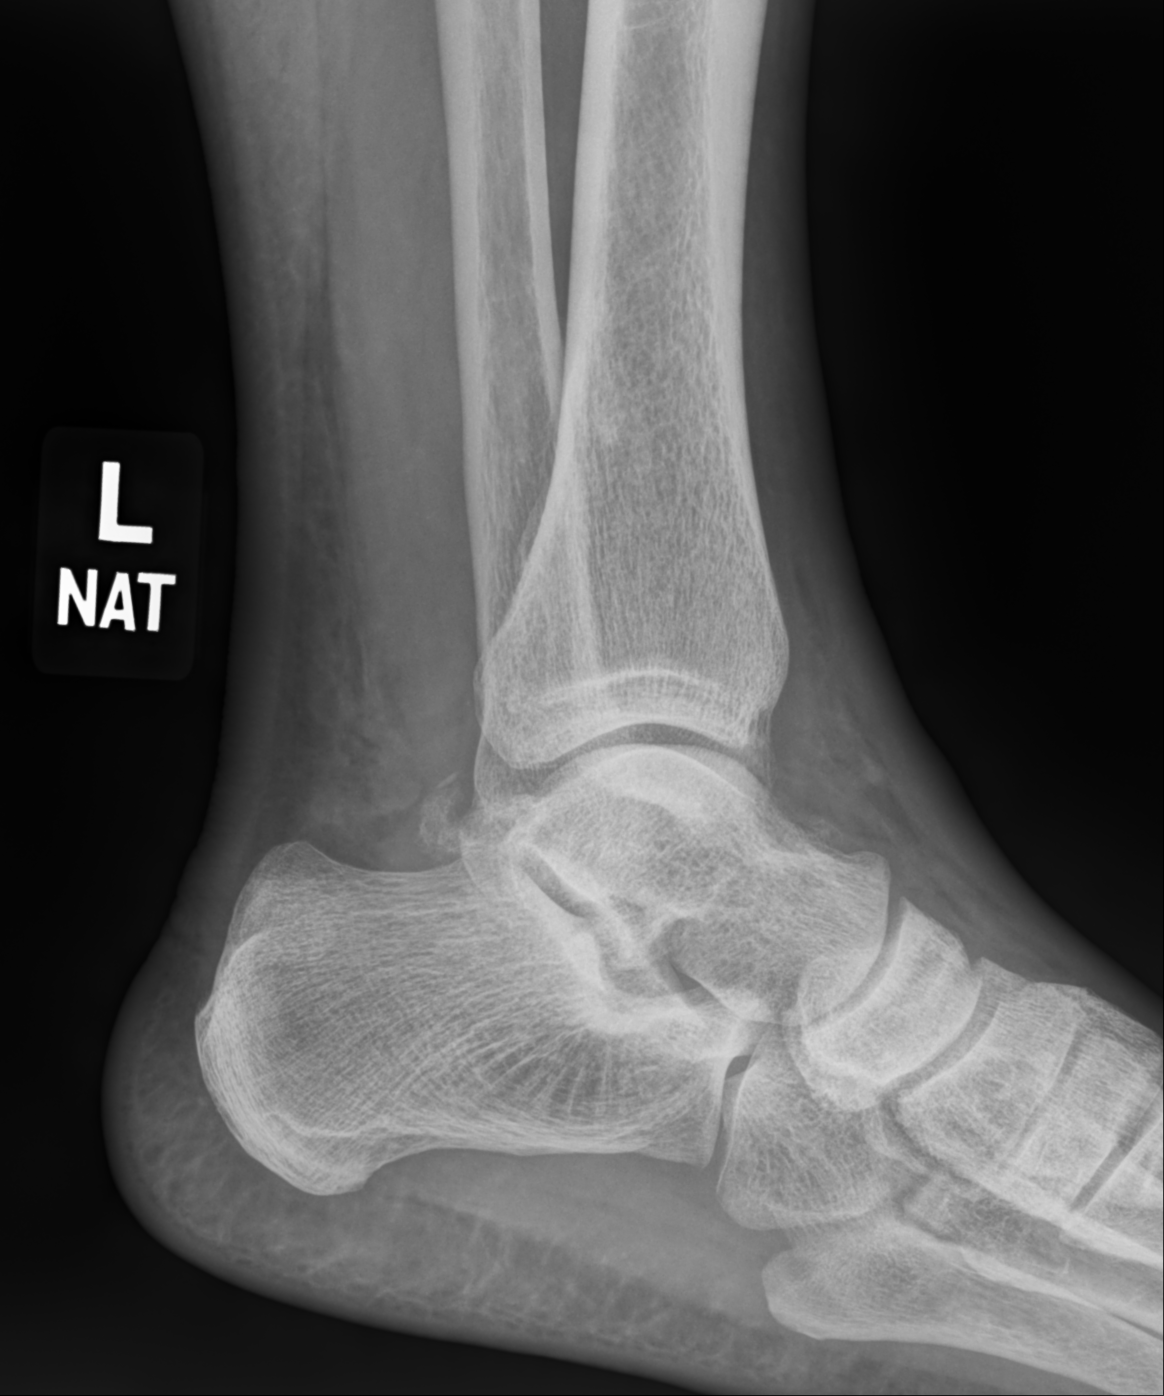

[3 of 3 positions shown; findings below may reference images not displayed]

FINDINGS: Diffuse soft tissue swelling. Mild degenerative changes of the ankle
joint. No acute osseous finding, fracture, or malalignment.
Malleoli, talus and calcaneus intact.
IMPRESSION: Soft tissue swelling and degenerative changes.

No acute finding or malalignment.

## 2022-03-12 DIAGNOSIS — M1A0211 Idiopathic chronic gout, right elbow, with tophus (tophi): Secondary | ICD-10-CM | POA: Diagnosis not present

## 2022-03-12 DIAGNOSIS — D485 Neoplasm of uncertain behavior of skin: Secondary | ICD-10-CM | POA: Diagnosis not present

## 2022-04-02 DIAGNOSIS — G5601 Carpal tunnel syndrome, right upper limb: Secondary | ICD-10-CM | POA: Insufficient documentation

## 2022-04-02 DIAGNOSIS — M25521 Pain in right elbow: Secondary | ICD-10-CM | POA: Diagnosis not present

## 2022-04-26 DIAGNOSIS — G5601 Carpal tunnel syndrome, right upper limb: Secondary | ICD-10-CM | POA: Diagnosis not present

## 2022-05-20 ENCOUNTER — Ambulatory Visit
Admission: EM | Admit: 2022-05-20 | Discharge: 2022-05-20 | Disposition: A | Discharge status: Home / Self Care | Payer: Medicare HMO | Attending: Family Medicine | Admitting: Family Medicine

## 2022-05-20 ENCOUNTER — Encounter: Payer: Self-pay | Admitting: Emergency Medicine

## 2022-05-20 ENCOUNTER — Other Ambulatory Visit: Payer: Self-pay

## 2022-05-20 DIAGNOSIS — J3489 Other specified disorders of nose and nasal sinuses: Secondary | ICD-10-CM | POA: Diagnosis not present

## 2022-05-20 DIAGNOSIS — R350 Frequency of micturition: Secondary | ICD-10-CM | POA: Diagnosis not present

## 2022-05-20 DIAGNOSIS — Z1152 Encounter for screening for COVID-19: Secondary | ICD-10-CM | POA: Insufficient documentation

## 2022-05-20 DIAGNOSIS — Z792 Long term (current) use of antibiotics: Secondary | ICD-10-CM | POA: Insufficient documentation

## 2022-05-20 DIAGNOSIS — G5601 Carpal tunnel syndrome, right upper limb: Secondary | ICD-10-CM | POA: Diagnosis not present

## 2022-05-20 DIAGNOSIS — Z79899 Other long term (current) drug therapy: Secondary | ICD-10-CM | POA: Insufficient documentation

## 2022-05-20 LAB — POCT URINALYSIS DIP (MANUAL ENTRY)
Bilirubin, UA: NEGATIVE
Blood, UA: NEGATIVE
Glucose, UA: NEGATIVE mg/dL
Ketones, POC UA: NEGATIVE mg/dL
Leukocytes, UA: NEGATIVE
Nitrite, UA: NEGATIVE
Protein Ur, POC: NEGATIVE mg/dL
Spec Grav, UA: 1.015 (ref 1.010–1.025)
Urobilinogen, UA: 0.2 E.U./dL
pH, UA: 5.5 (ref 5.0–8.0)

## 2022-05-20 MED ORDER — TAMSULOSIN HCL 0.4 MG PO CAPS: 0.4000 mg | ORAL_CAPSULE | Freq: Every day | ORAL | 0 refills | Status: AC

## 2022-05-20 MED ORDER — CIPROFLOXACIN HCL 250 MG PO TABS: 250.0000 mg | ORAL_TABLET | Freq: Two times a day (BID) | ORAL | 0 refills | Status: DC

## 2022-05-20 NOTE — Discharge Instructions (Signed)
Urinalysis was clear, but culture is sent.  Take Cipro 250 mg--1 tablet 2 times daily for 7 days  Tamsulosin 0.4 mg--1 daily.  This is to help urinary flow  You can use the QR code/website at the back of the summary paperwork to schedule yourself a new patient appointment with primary care

## 2022-05-20 NOTE — ED Provider Notes (Signed)
[facility]    CSN: [id number] Arrival date & time: 05/20/22  1532      History   Chief Complaint Chief Complaint  Patient presents with   Polyuria    HPI Thomas Ayala is a 66 y.o. male.   HPI Here for urinary frequency. Began evening of 12/25. No hematuria and no dysuria. Urine has been smelling.  No f/c/n/v/d. He has been having some nasal congestion and some slight cough x 2 days also. Took 2 sudafed, but yesterday, not before urinary symptoms started  Past Medical History:  Diagnosis Date   Arthritis    ED (erectile dysfunction)    Gout    Hypertension    Prostate cancer [facility])     Patient Active Problem List   Diagnosis Date Noted   Fear of public speaking 50/01/3817   Sepsis ([place]) 05/21/2016   Lower urinary tract infectious disease 05/21/2016   Gout 08/07/2011   Essential hypertension 08/07/2011   ED (erectile dysfunction) 08/07/2011    History reviewed. No pertinent surgical history.     Home Medications    Prior to Admission medications   Medication Sig Start Date End Date Taking? Authorizing Provider  ciprofloxacin (CIPRO) 250 MG tablet Take 1 tablet (250 mg total) by mouth every 12 (twelve) hours. 05/20/22  Yes Barrett Henle, MD  tamsulosin (FLOMAX) 0.4 MG CAPS capsule Take 1 capsule (0.4 mg total) by mouth daily. 05/20/22  Yes Barrett Henle, MD  azelastine (ASTELIN) 0.1 % nasal spray Place 1 spray into both nostrils 2 (two) times daily. Use in each nostril as directed 07/30/21   Maximiano Coss, NP  cetirizine (ZYRTEC) 10 MG tablet Take 1 tablet (10 mg total) by mouth daily. 07/30/21   Maximiano Coss, NP  clonazePAM (KLONOPIN) 0.5 MG disintegrating tablet Take 1 tablet (0.5 mg total) by mouth 2 (two) times daily. 09/11/21   Maximiano Coss, NP  colchicine 0.6 MG tablet Take 2 tablets (1.2 mg total) by mouth daily. Take 1.2 mg (2 tablets), then 0.6 mg (1 tablet) 1 hour later 04/24/21   Oswaldo Conroy E, FNP  hydrochlorothiazide  (HYDRODIURIL) 25 MG tablet TAKE 1 TABLET(25 MG) BY MOUTH DAILY 11/30/21   Maximiano Coss, NP  losartan (COZAAR) 100 MG tablet Take 1 tablet (100 mg total) by mouth daily. 12/14/21   Maximiano Coss, NP  methocarbamol (ROBAXIN) 500 MG tablet Take 1 tablet (500 mg total) by mouth at bedtime as needed for muscle spasms. 10/13/20   Maximiano Coss, NP  montelukast (SINGULAIR) 10 MG tablet Take 1 tablet (10 mg total) by mouth at bedtime. 07/30/21   Maximiano Coss, NP  oxymetazoline (AFRIN NASAL SPRAY) 0.05 % nasal spray Place 1 spray into both nostrils 2 (two) times daily. 07/30/21   Maximiano Coss, NP  tadalafil (CIALIS) 20 MG tablet Take 0.5-1 tablets (10-20 mg total) by mouth every other day as needed for erectile dysfunction. 09/18/21   Maximiano Coss, NP    Family History Family History  Problem Relation Age of Onset   Dementia Mother    Lung cancer Father    Prostate cancer Brother     Social History Social History   Tobacco Use   Smoking status: Former    Types: Cigarettes    Quit date: 08/07/1991    Years since quitting: 30.8   Smokeless tobacco: Never  Substance Use Topics   Alcohol use: Yes     Allergies   Patient has no known allergies.   Review of Systems  Review of Systems   Physical Exam Triage Vital Signs ED Triage Vitals [05/20/22 1732]  Enc Vitals Group     BP (!) 142/87     Pulse Rate 82     Resp 18     Temp 98.3 F (36.8 C)     Temp Source Oral     SpO2 96 %     Weight      Height      Head Circumference      Peak Flow      Pain Score 0     Pain Loc      Pain Edu?      Excl. in [place]?    No data found.  Updated Vital Signs BP (!) 142/87 (BP Location: Left Arm)   Pulse 82   Temp 98.3 F (36.8 C) (Oral)   Resp 18   SpO2 96%   Visual Acuity Right Eye Distance:   Left Eye Distance:   Bilateral Distance:    Right Eye Near:   Left Eye Near:    Bilateral Near:     Physical Exam Vitals reviewed.  Constitutional:      General: He is not in  acute distress.    Appearance: He is not ill-appearing, toxic-appearing or diaphoretic.  HENT:     Nose: Congestion present.     Mouth/Throat:     Mouth: Mucous membranes are moist.     Pharynx: No oropharyngeal exudate or posterior oropharyngeal erythema.  Eyes:     Extraocular Movements: Extraocular movements intact.     Conjunctiva/sclera: Conjunctivae normal.     Pupils: Pupils are equal, round, and reactive to light.  Cardiovascular:     Rate and Rhythm: Normal rate and regular rhythm.     Heart sounds: No murmur heard. Pulmonary:     Effort: Pulmonary effort is normal. No respiratory distress.     Breath sounds: Normal breath sounds. No stridor. No wheezing, rhonchi or rales.  Abdominal:     Palpations: Abdomen is soft.     Tenderness: There is no abdominal tenderness.  Musculoskeletal:     Cervical back: Neck supple.  Lymphadenopathy:     Cervical: No cervical adenopathy.  Skin:    Coloration: Skin is not jaundiced or pale.  Neurological:     General: No focal deficit present.     Mental Status: He is alert and oriented to person, place, and time.  Psychiatric:        Behavior: Behavior normal.      UC Treatments / Results  Labs (all labs ordered are listed, but only abnormal results are displayed) Labs Reviewed  POCT URINALYSIS DIP (MANUAL ENTRY) - Abnormal; Notable for the following components:      Result Value   Color, UA light yellow (*)    All other components within normal limits  SARS CORONAVIRUS 2 (TAT 6-24 HRS)  URINE CULTURE    EKG   Radiology No results found.  Procedures Procedures (including critical care time)  Medications Ordered in UC Medications - No data to display  Initial Impression / Assessment and Plan / UC Course  I have reviewed the triage vital signs and the nursing notes.  Pertinent labs & imaging results that were available during my care of the patient were reviewed by me and considered in my medical decision making  (see chart for details).          UA is clear. I am still sending urine c/s, and am concerned  some for prostatitis, though this could be BPH alone. Tx sent for UTI and BPH.  COVID swab is done with the new URI symptoms; if pos, he is a candidate for Paxlovid, as his last eGFR was 82 earlier this year Final Clinical Impressions(s) / UC Diagnoses   Final diagnoses:  Urinary frequency  Rhinorrhea     Discharge Instructions      Urinalysis was clear, but culture is sent.  Take Cipro 250 mg--1 tablet 2 times daily for 7 days  Tamsulosin 0.4 mg--1 daily.  This is to help urinary flow  You can use the QR code/website at the back of the summary paperwork to schedule yourself a new patient appointment with primary care        ED Prescriptions     Medication Sig Dispense Auth. Provider   ciprofloxacin (CIPRO) 250 MG tablet Take 1 tablet (250 mg total) by mouth every 12 (twelve) hours. 10 tablet Barrett Henle, MD   tamsulosin (FLOMAX) 0.4 MG CAPS capsule Take 1 capsule (0.4 mg total) by mouth daily. 30 capsule Barrett Henle, MD      PDMP not reviewed this encounter.   Barrett Henle, MD 05/20/22 1754

## 2022-05-20 NOTE — ED Triage Notes (Signed)
Pt here for polyuria x 2 days; pt sts does take hctz but denies change in dose etc; pt also sts nasal congestion

## 2022-05-21 LAB — URINE CULTURE: Culture: NO GROWTH

## 2022-05-21 LAB — SARS CORONAVIRUS 2 (TAT 6-24 HRS): SARS Coronavirus 2: NEGATIVE

## 2022-05-28 ENCOUNTER — Other Ambulatory Visit: Payer: Self-pay

## 2022-05-28 DIAGNOSIS — I10 Essential (primary) hypertension: Secondary | ICD-10-CM

## 2022-05-28 MED ORDER — HYDROCHLOROTHIAZIDE 25 MG PO TABS: ORAL_TABLET | ORAL | 0 refills | Status: DC

## 2022-08-06 ENCOUNTER — Other Ambulatory Visit: Payer: Self-pay

## 2022-08-06 DIAGNOSIS — I10 Essential (primary) hypertension: Secondary | ICD-10-CM

## 2022-08-06 MED ORDER — LOSARTAN POTASSIUM 100 MG PO TABS: 100.0000 mg | ORAL_TABLET | Freq: Every day | ORAL | 1 refills | Status: DC

## 2022-08-09 ENCOUNTER — Ambulatory Visit: Payer: Medicare HMO | Admitting: Family Medicine

## 2022-08-17 ENCOUNTER — Ambulatory Visit
Admission: EM | Admit: 2022-08-17 | Discharge: 2022-08-17 | Disposition: A | Discharge status: Home / Self Care | Payer: Medicare HMO | Attending: Family Medicine | Admitting: Family Medicine

## 2022-08-17 DIAGNOSIS — J069 Acute upper respiratory infection, unspecified: Secondary | ICD-10-CM

## 2022-08-17 MED ORDER — BENZONATATE 200 MG PO CAPS: 200.0000 mg | ORAL_CAPSULE | Freq: Three times a day (TID) | ORAL | 0 refills | Status: DC | PRN

## 2022-08-17 NOTE — ED Triage Notes (Signed)
Pt presents with non productive cough and congestion that has been unrelieved with OTC medication.

## 2022-08-17 NOTE — ED Provider Notes (Signed)
[facility]    CSN: [id number] Arrival date & time: 08/17/22  1122      History   Chief Complaint Chief Complaint  Patient presents with   URI    HPI Thomas Ayala is a 67 y.o. male.   Patient is here for URI symptoms.  He started with a cough about 6 days ago.  The cough worsened over the weekend.  He went to work yesterday, but didn't feel well afterward.  Having sinus congestion, drainage.  No wheezing or sob.  No fevers/chills.  He is having sinus pain/pressure, slight.  He has been using benadryl, mucinex, tylenol/cold.  His wife is sick as well.        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 No pertinent surgical history.     Home Medications    Prior to Admission medications   Medication Sig Start Date End Date Taking? Authorizing Provider  amLODipine (NORVASC) 10 MG tablet Take 1 tablet by mouth daily.    [provider]  amLODipine-benazepril (LOTREL) 10-20 MG capsule Take 1 capsule by mouth daily.    [provider]  azelastine (ASTELIN) 0.1 % nasal spray Place 1 spray into both nostrils 2 (two) times daily. Use in each nostril as directed 07/30/21   Maximiano Coss, NP  cetirizine (ZYRTEC) 10 MG tablet Take 1 tablet  (10 mg total) by mouth daily. 07/30/21   Maximiano Coss, NP  ciprofloxacin (CIPRO) 250 MG tablet Take 1 tablet (250 mg total) by mouth every 12 (twelve) hours. 05/20/22   Barrett Henle, MD  clonazePAM (KLONOPIN) 0.5 MG disintegrating tablet Take 1 tablet (0.5 mg total) by mouth 2 (two) times daily. 09/11/21   Maximiano Coss, NP  colchicine 0.6 MG tablet Take 2 tablets (1.2 mg total) by mouth daily. Take 1.2 mg (2 tablets), then 0.6 mg (1 tablet) 1 hour later 04/24/21   Oswaldo Conroy E, FNP  hydrochlorothiazide (HYDRODIURIL) 25 MG tablet TAKE 1 TABLET(25 MG) BY MOUTH DAILY 05/28/22   Wendie Agreste, MD  ibuprofen (ADVIL) 200 MG tablet Take by mouth.    [provider]  losartan (COZAAR) 100 MG tablet Take 1 tablet (100 mg total) by mouth daily. 08/06/22   Wendie Agreste, MD  methocarbamol (ROBAXIN) 500 MG tablet Take 1 tablet (500 mg total) by mouth at bedtime as needed for muscle spasms. 10/13/20   Maximiano Coss, NP  montelukast (SINGULAIR) 10 MG tablet Take 1 tablet (10 mg total) by mouth at bedtime. 07/30/21   Maximiano Coss, NP  oxymetazoline (AFRIN NASAL SPRAY) 0.05 % nasal spray Place 1 spray into both nostrils 2 (two) times daily. 07/30/21   Maximiano Coss, NP  tadalafil (CIALIS) 20 MG tablet Take 0.5-1 tablets (10-20 mg total) by  mouth every other day as needed for erectile dysfunction. 09/18/21   Maximiano Coss, NP  tamsulosin (FLOMAX) 0.4 MG CAPS capsule Take 1 capsule (0.4 mg total) by mouth daily. 05/20/22   Barrett Henle, MD  TURMERIC PO See admin instructions.    [provider]  Vitamin D, Ergocalciferol, (DRISDOL) 1.25 MG (50000 UNIT) CAPS capsule Take 1 capsule by mouth once a week. 09/28/13   [provider]    Family History Family History  Problem Relation Age of Onset   Dementia Mother    Lung cancer Father    Prostate cancer Brother     Social History Social History   Tobacco Use   Smoking status: Former    Types: Cigarettes    Quit  date: 08/07/1991    Years since quitting: 31.0   Smokeless tobacco: Never  Substance Use Topics   Alcohol use: Yes     Allergies   Benazepril hcl   Review of Systems Review of Systems  Constitutional:  Negative for chills and fever.  HENT:  Positive for congestion, rhinorrhea and sinus pressure.   Respiratory:  Positive for cough. Negative for shortness of breath and wheezing.   Cardiovascular: Negative.   Gastrointestinal: Negative.   Musculoskeletal: Negative.   Psychiatric/Behavioral: Negative.       Physical Exam Triage Vital Signs ED Triage Vitals  Enc Vitals Group     BP 08/17/22 1157 129/86     Pulse Rate 08/17/22 1157 79     Resp 08/17/22 1157 17     Temp 08/17/22 1157 98.2 F (36.8 C)     Temp Source 08/17/22 1157 Oral     SpO2 08/17/22 1157 94 %     Weight --      Height --      Head Circumference --      Peak Flow --      Pain Score 08/17/22 1156 4     Pain Loc --      Pain Edu? --      Excl. in [place]? --    No data found.  Updated Vital Signs BP 129/86 (BP Location: Right Arm)   Pulse 79   Temp 98.2 F (36.8 C) (Oral)   Resp 17   SpO2 94%   Visual Acuity Right Eye Distance:   Left Eye Distance:   Bilateral Distance:    Right Eye Near:   Left Eye Near:    Bilateral Near:     Physical Exam Constitutional:      Appearance: Normal appearance.  HENT:     Right Ear: Tympanic membrane normal.     Left Ear: Tympanic membrane normal.     Nose: Congestion and rhinorrhea present.     Right Sinus: No maxillary sinus tenderness or frontal sinus tenderness.     Left Sinus: No maxillary sinus tenderness or frontal sinus tenderness.  Cardiovascular:     Rate and Rhythm: Normal rate and regular rhythm.  Pulmonary:     Effort: Pulmonary effort is normal.     Breath sounds: Normal breath sounds. No wheezing, rhonchi or rales.  Musculoskeletal:     Cervical back: Normal range of motion and neck supple.  Skin:    General: Skin is warm.   Neurological:     General: No focal deficit present.     Mental Status: He is alert.  Psychiatric:        Mood and Affect: Mood normal.      UC Treatments /  Results  Labs (all labs ordered are listed, but only abnormal results are displayed) Labs Reviewed - No data to display  EKG   Radiology No results found.  Procedures Procedures (including critical care time)  Medications Ordered in UC Medications - No data to display  Initial Impression / Assessment and Plan / UC Course  I have reviewed the triage vital signs and the nursing notes.  Pertinent labs & imaging results that were available during my care of the patient were reviewed by me and considered in my medical decision making (see chart for details).   Final Clinical Impressions(s) / UC Diagnoses   Final diagnoses:  Viral upper respiratory infection     Discharge Instructions      You were seen today for viral upper respiratory infection.  I have sent out a pill to help with the cough.  I recommend you continue over the counter medications for your sinus congestion and drainage.  If you develop fever, shortness of breath, or sinus pain/pressure into next week then please return for further evaluation.      ED Prescriptions     Medication Sig Dispense Auth. Provider   benzonatate (TESSALON) 200 MG capsule Take 1 capsule (200 mg total) by mouth 3 (three) times daily as needed for cough. 21 capsule Rondel Oh, MD      PDMP not reviewed this encounter.   Rondel Oh, MD 08/17/22 1214

## 2022-08-17 NOTE — Discharge Instructions (Signed)
You were seen today for viral upper respiratory infection.  I have sent out a pill to help with the cough.  I recommend you continue over the counter medications for your sinus congestion and drainage.  If you develop fever, shortness of breath, or sinus pain/pressure into next week then please return for further evaluation.

## 2022-09-08 ENCOUNTER — Ambulatory Visit: Payer: Medicare HMO | Admitting: Emergency Medicine

## 2022-09-17 ENCOUNTER — Ambulatory Visit
Admission: EM | Admit: 2022-09-17 | Discharge: 2022-09-17 | Disposition: A | Discharge status: Home / Self Care | Payer: Medicare HMO | Attending: Family Medicine | Admitting: Family Medicine

## 2022-09-17 DIAGNOSIS — R42 Dizziness and giddiness: Secondary | ICD-10-CM

## 2022-09-17 NOTE — Discharge Instructions (Signed)
We have drawn blood to check your blood counts and your sodium and potassium and sugar and kidney function numbers.  If anything is significantly abnormal, our staff will call you.  You are welcome to call here if you see the results on your chart and cannot tell if there is any cause for concern

## 2022-09-17 NOTE — ED Triage Notes (Signed)
Pt c/o dizzy spells x2 days when he bents over lasting a few minutes.

## 2022-09-17 NOTE — ED Provider Notes (Signed)
[facility]    CSN: [id number] Arrival date & time: 09/17/22  1750      History   Chief Complaint Chief Complaint  Patient presents with   Dizziness    HPI Thomas Ayala is a 67 y.o. male.    Dizziness  Here for dizziness and lightheadedness.  Symptoms began about 2 days ago.  He is mainly noted it when he arises from bending over or rises from a lying down position.  When asked if it is lightheadedness or spinning, he states maybe a little of both.  He has not had any nausea or vomiting and no diarrhea.  No cough or congestion or fever.  He does drink a lot of water every day.  He takes losartan for hypertension.    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 No pertinent surgical history.     Home Medications    Prior to Admission medications   Medication Sig Start Date End Date Taking? Authorizing Provider  amLODipine (NORVASC) 10 MG tablet Take 1 tablet by mouth daily.    [provider]  amLODipine-benazepril (LOTREL) 10-20 MG capsule Take 1 capsule by mouth daily.    [provider]  cetirizine (ZYRTEC) 10 MG tablet Take 1 tablet (10 mg total) by mouth daily. 07/30/21   Janeece Agee, NP  hydrochlorothiazide (HYDRODIURIL) 25 MG  tablet TAKE 1 TABLET(25 MG) BY MOUTH DAILY 05/28/22   Shade Flood, MD  ibuprofen (ADVIL) 200 MG tablet Take by mouth.    [provider]  losartan (COZAAR) 100 MG tablet Take 1 tablet (100 mg total) by mouth daily. 08/06/22   Shade Flood, MD  methocarbamol (ROBAXIN) 500 MG tablet Take 1 tablet (500 mg total) by mouth at bedtime as needed for muscle spasms. 10/13/20   Janeece Agee, NP  montelukast (SINGULAIR) 10 MG tablet Take 1 tablet (10 mg total) by mouth at bedtime. 07/30/21   Janeece Agee, NP  oxymetazoline (AFRIN NASAL SPRAY) 0.05 % nasal spray Place 1 spray into both nostrils 2 (two) times daily. 07/30/21   Janeece Agee, NP  tadalafil (CIALIS) 20 MG tablet Take 0.5-1 tablets (10-20 mg total) by mouth every other day as needed for erectile dysfunction. 09/18/21   Janeece Agee, NP  tamsulosin (FLOMAX) 0.4 MG CAPS capsule Take 1 capsule (0.4 mg total) by mouth daily. 05/20/22   Zenia Resides, MD  TURMERIC PO See admin instructions.    [provider]  Vitamin D, Ergocalciferol, (DRISDOL) 1.25 MG (50000 UNIT) CAPS capsule Take 1 capsule by mouth once a week. 09/28/13   [provider]    Family History Family History  Problem Relation Age of Onset   Dementia Mother    Lung cancer Father  Prostate cancer Brother     Social History Social History   Tobacco Use   Smoking status: Former    Types: Cigarettes    Quit date: 08/07/1991    Years since quitting: 31.1   Smokeless tobacco: Never  Substance Use Topics   Alcohol use: Yes     Allergies   Benazepril hcl   Review of Systems Review of Systems  Neurological:  Positive for dizziness.     Physical Exam Triage Vital Signs ED Triage Vitals  Enc Vitals Group     BP 09/17/22 1911 125/89     Pulse Rate 09/17/22 1911 74     Resp 09/17/22 1911 18     Temp 09/17/22 1911 98.4 F (36.9 C)     Temp Source 09/17/22 1911 Oral     SpO2 09/17/22 1911 96 %     Weight --      Height  --      Head Circumference --      Peak Flow --      Pain Score 09/17/22 1912 0     Pain Loc --      Pain Edu? --      Excl. in [place]? --    No data found.  Updated Vital Signs BP 125/89 (BP Location: Left Arm)   Pulse 74   Temp 98.4 F (36.9 C) (Oral)   Resp 18   SpO2 96%   Visual Acuity Right Eye Distance:   Left Eye Distance:   Bilateral Distance:    Right Eye Near:   Left Eye Near:    Bilateral Near:     Physical Exam Vitals reviewed.  Constitutional:      General: He is not in acute distress.    Appearance: He is not ill-appearing, toxic-appearing or diaphoretic.  HENT:     Nose: Nose normal.     Mouth/Throat:     Mouth: Mucous membranes are moist.     Pharynx: No oropharyngeal exudate or posterior oropharyngeal erythema.  Eyes:     Extraocular Movements: Extraocular movements intact.     Conjunctiva/sclera: Conjunctivae normal.     Pupils: Pupils are equal, round, and reactive to light.  Cardiovascular:     Rate and Rhythm: Normal rate and regular rhythm.     Heart sounds: No murmur heard. Pulmonary:     Effort: Pulmonary effort is normal.     Breath sounds: Normal breath sounds.  Musculoskeletal:     Cervical back: Neck supple.  Lymphadenopathy:     Cervical: No cervical adenopathy.  Skin:    Capillary Refill: Capillary refill takes less than 2 seconds.     Coloration: Skin is not jaundiced or pale.  Neurological:     General: No focal deficit present.     Mental Status: He is alert and oriented to person, place, and time.  Psychiatric:        Behavior: Behavior normal.      UC Treatments / Results  Labs (all labs ordered are listed, but only abnormal results are displayed) Labs Reviewed - No data to display  EKG   Radiology No results found.  Procedures Procedures (including critical care time)  Medications Ordered in UC Medications - No data to display  Initial Impression / Assessment and Plan / UC Course  I have reviewed the  triage vital signs and the nursing notes.  Pertinent labs & imaging results that were available during my care of the patient were reviewed by me and  considered in my medical decision making (see chart for details).     With his not having any nausea and with the dizziness only being provoked by sitting up and not by rolling over or looking to the side, I do not think that this is vertigo.  Heart rate and blood pressure are reassuring here.  CBC and BMP are drawn today, and staff will notify him if anything needs treatment   Final Clinical Impressions(s) / UC Diagnoses   Final diagnoses:  Dizziness   Discharge Instructions   None    ED Prescriptions   None    PDMP not reviewed this encounter.   Zenia Resides, MD 09/17/22 [phone]

## 2022-09-21 LAB — BASIC METABOLIC PANEL
BUN/Creatinine Ratio: 12 (ref 10–24)
BUN: 14 mg/dL (ref 8–27)
CO2: 20 mmol/L (ref 20–29)
Calcium: 9.8 mg/dL (ref 8.6–10.2)
Chloride: 105 mmol/L (ref 96–106)
Creatinine, Ser: 1.15 mg/dL (ref 0.76–1.27)
Glucose: 62 mg/dL — ABNORMAL LOW (ref 70–99)
Potassium: 4.9 mmol/L (ref 3.5–5.2)
Sodium: 143 mmol/L (ref 134–144)
eGFR: 70 mL/min/{1.73_m2} (ref >59)

## 2022-09-21 LAB — CBC
Hematocrit: 43.8 % (ref 37.5–51.0)
Hemoglobin: 14 g/dL (ref 13.0–17.7)
MCH: 28.4 pg (ref 26.6–33.0)
MCHC: 32 g/dL (ref 31.5–35.7)
MCV: 89 fL (ref 79–97)
Platelets: 205 10*3/uL (ref 150–450)
RBC: 4.93 x10E6/uL (ref 4.14–5.80)
RDW: 13.1 % (ref 11.6–15.4)
WBC: 9.6 10*3/uL (ref 3.4–10.8)

## 2022-11-07 ENCOUNTER — Other Ambulatory Visit: Payer: Self-pay | Admitting: Family Medicine

## 2022-11-07 DIAGNOSIS — I10 Essential (primary) hypertension: Secondary | ICD-10-CM

## 2022-11-12 ENCOUNTER — Ambulatory Visit (INDEPENDENT_AMBULATORY_CARE_PROVIDER_SITE_OTHER): Payer: Medicare HMO | Admitting: Internal Medicine

## 2022-11-12 ENCOUNTER — Encounter: Payer: Self-pay | Admitting: Internal Medicine

## 2022-11-12 VITALS — BP 134/100 | HR 63 | Temp 98.1°F | Ht 69.0 in | Wt 220.0 lb

## 2022-11-12 DIAGNOSIS — E559 Vitamin D deficiency, unspecified: Secondary | ICD-10-CM | POA: Diagnosis not present

## 2022-11-12 DIAGNOSIS — Z8546 Personal history of malignant neoplasm of prostate: Secondary | ICD-10-CM

## 2022-11-12 DIAGNOSIS — N529 Male erectile dysfunction, unspecified: Secondary | ICD-10-CM | POA: Diagnosis not present

## 2022-11-12 DIAGNOSIS — M1A0210 Idiopathic chronic gout, right elbow, without tophus (tophi): Secondary | ICD-10-CM

## 2022-11-12 DIAGNOSIS — I10 Essential (primary) hypertension: Secondary | ICD-10-CM

## 2022-11-12 DIAGNOSIS — Z1211 Encounter for screening for malignant neoplasm of colon: Secondary | ICD-10-CM

## 2022-11-12 LAB — COMPREHENSIVE METABOLIC PANEL
ALT: 22 U/L (ref 0–53)
AST: 25 U/L (ref 0–37)
Albumin: 4.4 g/dL (ref 3.5–5.2)
Alkaline Phosphatase: 39 U/L (ref 39–117)
BUN: 14 mg/dL (ref 6–23)
CO2: 30 mEq/L (ref 19–32)
Calcium: 9.7 mg/dL (ref 8.4–10.5)
Chloride: 103 mEq/L (ref 96–112)
Creatinine, Ser: 1.09 mg/dL (ref 0.40–1.50)
GFR: 70.53 mL/min (ref >60.00)
Glucose, Bld: 80 mg/dL (ref 70–99)
Potassium: 4.1 mEq/L (ref 3.5–5.1)
Sodium: 140 mEq/L (ref 135–145)
Total Bilirubin: 0.8 mg/dL (ref 0.2–1.2)
Total Protein: 7.1 g/dL (ref 6.0–8.3)

## 2022-11-12 LAB — LIPID PANEL
Cholesterol: 146 mg/dL (ref 0–200)
HDL: 38.2 mg/dL — ABNORMAL LOW (ref >39.00)
LDL Cholesterol: 98 mg/dL (ref 0–99)
NonHDL: 108.23
Total CHOL/HDL Ratio: 4
Triglycerides: 52 mg/dL (ref 0.0–149.0)
VLDL: 10.4 mg/dL (ref 0.0–40.0)

## 2022-11-12 LAB — CBC
HCT: 40.8 % (ref 39.0–52.0)
Hemoglobin: 13.4 g/dL (ref 13.0–17.0)
MCHC: 32.9 g/dL (ref 30.0–36.0)
MCV: 86.6 fl (ref 78.0–100.0)
Platelets: 210 10*3/uL (ref 150.0–400.0)
RBC: 4.72 Mil/uL (ref 4.22–5.81)
RDW: 14.3 % (ref 11.5–15.5)
WBC: 8.6 10*3/uL (ref 4.0–10.5)

## 2022-11-12 LAB — URIC ACID: Uric Acid, Serum: 8 mg/dL — ABNORMAL HIGH (ref 4.0–7.8)

## 2022-11-12 LAB — VITAMIN D 25 HYDROXY (VIT D DEFICIENCY, FRACTURES): VITD: 84.18 ng/mL (ref 30.00–100.00)

## 2022-11-12 LAB — HEMOGLOBIN A1C: Hgb A1c MFr Bld: 5.3 % (ref 4.6–6.5)

## 2022-11-12 LAB — PSA: PSA: 19.07 ng/mL — ABNORMAL HIGH (ref 0.10–4.00)

## 2022-11-12 MED ORDER — LOSARTAN POTASSIUM-HCTZ 100-25 MG PO TABS: 1.0000 | ORAL_TABLET | Freq: Every day | ORAL | 3 refills | Status: DC

## 2022-11-12 NOTE — Assessment & Plan Note (Signed)
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

## 2022-11-12 NOTE — Assessment & Plan Note (Signed)
Checking PSA for follow up. Taking flomax 0.4 mg daily for symptoms.

## 2022-11-12 NOTE — Assessment & Plan Note (Signed)
He has not taken losartan or hydrochlorothiazide in 2 days due to missing this. We talked about compliance and importance of consistent BP control. Prior BP on meds 07/2022 and 08/2022 were at goal. We will combine to losartan/hydrochlorothiazide 100/25 to help with compliance to reduce pills. New rx done today. Checking CMP.

## 2022-11-12 NOTE — Assessment & Plan Note (Signed)
Uses tadalafil and others as needed for ED.

## 2022-11-12 NOTE — Patient Instructions (Signed)
We have sent in the combination blood pressure medicine to take instead of the separate losartan and hydrochlorothiazide.   We have sent in the colon cancer screening to come to your house.

## 2022-11-12 NOTE — Assessment & Plan Note (Signed)
Not taking supplement currently checking vitamin D level today and treat as appropriate.

## 2022-11-12 NOTE — Progress Notes (Signed)
   Subjective:   Patient ID: Thomas Ayala, male    DOB: [birth year], 67 y.o.   MRN: [id number]  HPI The patient is a transfer of care 67 YO man coming in for ongoing care see A/P for details  PMH, [facility], social history reviewed and updated  Review of Systems  Constitutional: Negative.   HENT: Negative.    Eyes: Negative.   Respiratory:  Negative for cough, chest tightness and shortness of breath.   Cardiovascular:  Negative for chest pain, palpitations and leg swelling.  Gastrointestinal:  Negative for abdominal distention, abdominal pain, constipation, diarrhea, nausea and vomiting.  Musculoskeletal: Negative.   Skin: Negative.   Neurological: Negative.   Psychiatric/Behavioral: Negative.      Objective:  Physical Exam Constitutional:      Appearance: He is well-developed.  HENT:     Head: Normocephalic and atraumatic.  Cardiovascular:     Rate and Rhythm: Normal rate and regular rhythm.  Pulmonary:     Effort: Pulmonary effort is normal. No respiratory distress.     Breath sounds: Normal breath sounds. No wheezing or rales.  Abdominal:     General: Bowel sounds are normal. There is no distension.     Palpations: Abdomen is soft.     Tenderness: There is no abdominal tenderness. There is no rebound.  Musculoskeletal:     Cervical back: Normal range of motion.  Skin:    General: Skin is warm and dry.  Neurological:     Mental Status: He is alert and oriented to person, place, and time.     Coordination: Coordination normal.     Vitals:   11/12/22 1447 11/12/22 1449  BP: (!) 134/100 (!) 134/100  Pulse: 63   Temp: 98.1 F (36.7 C)   TempSrc: Oral   SpO2: 96%   Weight: 220 lb (99.8 kg)   Height: 5\' 9"  (1.753 m)     Assessment & Plan:  Visit time 25 minutes in face to face communication with patient and coordination of care, additional 15 minutes spent in record review, coordination or care, ordering tests, communicating/referring to other healthcare  professionals, documenting in medical records all on the same day of the visit for total time 40 minutes spent on the visit.

## 2022-11-17 ENCOUNTER — Telehealth: Payer: Self-pay | Admitting: Internal Medicine

## 2022-11-17 NOTE — Telephone Encounter (Signed)
Patient would like a call back to go over recent lab results. Best callback is [phone].

## 2022-11-17 NOTE — Telephone Encounter (Signed)
Spoke with patient about results.

## 2023-01-06 ENCOUNTER — Encounter (INDEPENDENT_AMBULATORY_CARE_PROVIDER_SITE_OTHER): Payer: Self-pay

## 2023-01-18 ENCOUNTER — Ambulatory Visit: Payer: Medicare HMO

## 2023-01-21 ENCOUNTER — Ambulatory Visit
Admission: EM | Admit: 2023-01-21 | Discharge: 2023-01-21 | Disposition: A | Discharge status: Home / Self Care | Payer: Medicare HMO | Attending: Physician Assistant | Admitting: Physician Assistant

## 2023-01-21 DIAGNOSIS — M25522 Pain in left elbow: Secondary | ICD-10-CM | POA: Diagnosis not present

## 2023-01-21 MED ORDER — PREDNISONE 20 MG PO TABS: 40.0000 mg | ORAL_TABLET | Freq: Every day | ORAL | 0 refills | Status: AC

## 2023-01-21 NOTE — ED Triage Notes (Signed)
Patient presents to UC for left elbow pain x 6 weeks. States he has tennis elbow, req prednisone. States treating swelling and aching pain with tylenol, ibuprofen with no relief. States he is constantly using elbow for work so flare-ups.

## 2023-01-21 NOTE — ED Provider Notes (Signed)
[facility]    CSN: [id number] Arrival date & time: 01/21/23  1609      History   Chief Complaint Chief Complaint  Patient presents with   Arm Pain    HPI Thomas Ayala is a 67 y.o. male.   Patient here today for evaluation of left elbow pain he has had the last six weeks. He reports pain is not constant but certain movements tend to worsen pain. He does have history of tennis elbow, denies any known injury. He denies any numbness or tingling. He has taken tylenol and ibuprofen without resolution.   The history is provided by the patient.  Arm Pain Pertinent negatives include no shortness of breath.    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 No pertinent surgical history.     Home Medications    Prior to Admission medications   Medication Sig Start Date End Date Taking? Authorizing Provider  predniSONE (DELTASONE) 20 MG tablet Take 2 tablets (40 mg total) by mouth daily with breakfast for 5 days. 01/21/23 01/26/23 Yes Tomi Bamberger, PA-C  cetirizine (ZYRTEC) 10 MG tablet Take 1 tablet (10 mg total) by mouth daily. 07/30/21   Janeece Agee, NP  ibuprofen (ADVIL) 200 MG tablet Take by mouth.    [provider]  losartan-hydrochlorothiazide (HYZAAR) 100-25 MG tablet Take 1 tablet by mouth daily. 11/12/22   Myrlene Broker, MD  methocarbamol (ROBAXIN) 500 MG tablet Take 1 tablet (500 mg total) by mouth at bedtime as needed for muscle spasms. 10/13/20   Janeece Agee, NP  montelukast (SINGULAIR) 10 MG tablet Take 1 tablet (10 mg total) by mouth at bedtime. 07/30/21   Janeece Agee, NP  oxymetazoline (AFRIN NASAL SPRAY) 0.05 % nasal spray Place 1 spray into both nostrils 2 (two) times daily. 07/30/21   Janeece Agee, NP  tadalafil (CIALIS) 20 MG tablet Take 0.5-1 tablets (10-20 mg total) by mouth every other day as needed for erectile dysfunction. 09/18/21   Janeece Agee, NP  tamsulosin (FLOMAX) 0.4 MG CAPS capsule Take 1 capsule (0.4 mg total) by mouth daily. 05/20/22   Zenia Resides, MD  TURMERIC PO See admin instructions.    [provider]  Vitamin D, Ergocalciferol, (DRISDOL) 1.25 MG (50000 UNIT) CAPS capsule Take 1 capsule by mouth once a week. 09/28/13   [provider]    Family History Family History  Problem Relation Age of Onset   Dementia Mother    Lung cancer Father    Prostate cancer Brother     Social History Social History   Tobacco Use   Smoking status: Former    Current packs/day: 0.00    Types: Cigarettes    Quit date: 08/07/1991    Years since quitting: 31.4   Smokeless tobacco: Never  Substance Use Topics   Alcohol use: Yes     Allergies   Benazepril hcl   Review of Systems Review of Systems  Constitutional:  Negative for chills and fever.  Eyes:  Negative for discharge and redness.  Respiratory:  Negative for shortness of breath.   Musculoskeletal:  Positive for arthralgias. Negative for joint swelling.  Skin:  Negative for color change.  Neurological:  Negative for numbness.     Physical Exam Triage Vital Signs ED Triage Vitals [01/21/23 1619]  Encounter Vitals Group     BP 118/80     Systolic BP Percentile      Diastolic BP Percentile      Pulse Rate 67     Resp 18     Temp 98.5 F (36.9 C)     Temp Source Oral     SpO2 96 %     Weight      Height      Head Circumference      Peak Flow      Pain Score      Pain Loc      Pain Education      Exclude from Growth  Chart    No data found.  Updated Vital Signs BP 118/80 (BP Location: Left Arm)   Pulse 67   Temp 98.5 F (36.9 C) (Oral)   Resp 18   SpO2 96%     Physical Exam Vitals and nursing note reviewed.  Constitutional:      General: He is not in acute distress.    Appearance: Normal appearance. He is not ill-appearing.  HENT:     Head: Normocephalic and atraumatic.     Nose: Nose normal.  Eyes:     Conjunctiva/sclera: Conjunctivae normal.  Cardiovascular:     Rate and Rhythm: Normal rate.  Pulmonary:     Effort: Pulmonary effort is normal. No respiratory distress.  Musculoskeletal:     Comments: Full ROM of left elbow, wrist, hand, fingers- no swelling, TTP to left elbow  Neurological:     Mental Status: He is alert.     Comments: Gross sensation intact to left fingers distally  Psychiatric:        Mood and Affect: Mood normal.        Behavior: Behavior normal.      UC Treatments / Results  Labs (all labs ordered are listed, but only abnormal results are displayed) Labs Reviewed - No data to display  EKG   Radiology No results found.  Procedures Procedures (including critical care time)  Medications Ordered in UC Medications - No data to display  Initial Impression / Assessment and Plan / UC Course  I have reviewed the triage vital signs and the nursing notes.  Pertinent labs & imaging results that were available during my care of the patient were reviewed by me and considered in my medical decision making (see chart for details).    Steroid burst prescribed to hopefully decreased inflammation and improve pain. Encouraged follow up if no improvement or with any worsening symptoms.  Final Clinical Impressions(s) / UC Diagnoses   Final diagnoses:  Left elbow pain   Discharge Instructions   None    ED Prescriptions     Medication Sig Dispense Auth. Provider   predniSONE (DELTASONE) 20 MG tablet Take 2 tablets (40 mg total) by mouth daily with breakfast  for 5 days. 10 tablet Tomi Bamberger, PA-C      PDMP not reviewed this encounter.   Tomi Bamberger, PA-C 01/21/23 [id number]

## 2023-01-25 ENCOUNTER — Ambulatory Visit: Payer: Medicare HMO | Admitting: Internal Medicine

## 2023-02-03 ENCOUNTER — Other Ambulatory Visit: Payer: Self-pay | Admitting: Pharmacist

## 2023-02-03 NOTE — Progress Notes (Signed)
Patient ID: Thomas Ayala, male   DOB: 1956-01-24, 67 y.o.   MRN: [id number]  Patient appearing on report for [employer] [facility] - Hypertension Control report due to last documented ambulatory blood pressure of 134/100 on 11/12/22. Next appointment with PCP is 02/25/2023   Outreached patient to discuss hypertension control and medication management.   Current antihypertensives: losartan/hydrochlorothiazide 100-25 mg daily  Patient does not have an automated upper arm home BP machine.  Current blood pressure readings: none from home, pt provided 118/80 reading from urgent care on 01/21/23   Patient denies hypotensive signs and symptoms including dizziness, lightheadedness.      Assessment/Plan: - Currently controlled per most recent BP at urgent care. - - Reviewed to check blood pressure at home weekly, document, and provide at next provider visit  Arbutus Leas, PharmD, BCPS [facility] [phone]

## 2023-02-15 DIAGNOSIS — Z1211 Encounter for screening for malignant neoplasm of colon: Secondary | ICD-10-CM | POA: Diagnosis not present

## 2023-02-22 LAB — COLOGUARD: COLOGUARD: NEGATIVE

## 2023-02-23 ENCOUNTER — Encounter: Payer: Self-pay | Admitting: Internal Medicine

## 2023-02-23 LAB — COLOGUARD: Cologuard: NEGATIVE

## 2023-02-25 ENCOUNTER — Ambulatory Visit (INDEPENDENT_AMBULATORY_CARE_PROVIDER_SITE_OTHER): Payer: Medicare HMO

## 2023-02-25 ENCOUNTER — Ambulatory Visit: Payer: Medicare HMO | Admitting: Internal Medicine

## 2023-02-25 ENCOUNTER — Encounter: Payer: Self-pay | Admitting: Internal Medicine

## 2023-02-25 VITALS — BP 120/80 | HR 71 | Ht 68.0 in | Wt 221.0 lb

## 2023-02-25 DIAGNOSIS — Z0000 Encounter for general adult medical examination without abnormal findings: Secondary | ICD-10-CM

## 2023-02-25 DIAGNOSIS — C61 Malignant neoplasm of prostate: Secondary | ICD-10-CM

## 2023-02-25 DIAGNOSIS — I10 Essential (primary) hypertension: Secondary | ICD-10-CM

## 2023-02-25 DIAGNOSIS — N529 Male erectile dysfunction, unspecified: Secondary | ICD-10-CM

## 2023-02-25 LAB — COMPREHENSIVE METABOLIC PANEL WITH GFR
ALT: 17 U/L (ref 0–53)
AST: 26 U/L (ref 0–37)
Albumin: 4.5 g/dL (ref 3.5–5.2)
Alkaline Phosphatase: 40 U/L (ref 39–117)
BUN: 14 mg/dL (ref 6–23)
CO2: 29 meq/L (ref 19–32)
Calcium: 9.7 mg/dL (ref 8.4–10.5)
Chloride: 104 meq/L (ref 96–112)
Creatinine, Ser: 0.97 mg/dL (ref 0.40–1.50)
GFR: 80.96 mL/min
Glucose, Bld: 82 mg/dL (ref 70–99)
Potassium: 4 meq/L (ref 3.5–5.1)
Sodium: 141 meq/L (ref 135–145)
Total Bilirubin: 0.7 mg/dL (ref 0.2–1.2)
Total Protein: 7.2 g/dL (ref 6.0–8.3)

## 2023-02-25 LAB — PSA: PSA: 24 ng/mL — ABNORMAL HIGH (ref 0.10–4.00)

## 2023-02-25 MED ORDER — SILDENAFIL CITRATE 100 MG PO TABS: 100.0000 mg | ORAL_TABLET | Freq: Every day | ORAL | 11 refills | Status: DC | PRN

## 2023-02-25 MED ORDER — TADALAFIL 20 MG PO TABS: 10.0000 mg | ORAL_TABLET | ORAL | 11 refills | Status: DC | PRN

## 2023-02-25 NOTE — Assessment & Plan Note (Signed)
Flu shot declines. Pneumonia declines. Shingrix declines. Tetanus declines. Cologuard up to date. Counseled about sun safety and mole surveillance. Counseled about the dangers of distracted driving. Given 10 year screening recommendations.

## 2023-02-25 NOTE — Assessment & Plan Note (Addendum)
PSA rising will recheck today and asked him to return to urology sooner. He did not elect treatment and met with rad onc back in 2023 did not follow through. He is aware that rising PSA means his prostate cancer is spreading. No specific bone pain to suspect bone mets.

## 2023-02-25 NOTE — Progress Notes (Signed)
Subjective:   Patient ID: Thomas Ayala, male    DOB: Dec 23, 1955, 67 y.o.   MRN: [id number]  HPI The patient is a 67 YO man coming in for physical.  PMH, [facility], social history reviewed and updated.   Review of Systems  Constitutional: Negative.   HENT: Negative.    Eyes: Negative.   Respiratory:  Negative for cough, chest tightness and shortness of breath.   Cardiovascular:  Negative for chest pain, palpitations and leg swelling.  Gastrointestinal:  Negative for abdominal distention, abdominal pain, constipation, diarrhea, nausea and vomiting.  Musculoskeletal: Negative.   Skin: Negative.   Neurological: Negative.   Psychiatric/Behavioral: Negative.      Objective:  Physical Exam Constitutional:      Appearance: He is well-developed.  HENT:     Head: Normocephalic and atraumatic.  Cardiovascular:     Rate and Rhythm: Normal rate and regular rhythm.  Pulmonary:     Effort: Pulmonary effort is normal. No respiratory distress.     Breath sounds: Normal breath sounds. No wheezing or rales.  Abdominal:     General: Bowel sounds are normal. There is no distension.     Palpations: Abdomen is soft.     Tenderness: There is no abdominal tenderness. There is no rebound.  Musculoskeletal:     Cervical back: Normal range of motion.  Skin:    General: Skin is warm and dry.  Neurological:     Mental Status: He is alert and oriented to person, place, and time.     Coordination: Coordination normal.     Vitals:   02/25/23 1532  BP: 120/80  Pulse: 71  TempSrc: Oral  SpO2: 99%  Weight: 221 lb (100.2 kg)  Height: 5\' 8"  (1.727 m)    Assessment & Plan:

## 2023-02-25 NOTE — Assessment & Plan Note (Signed)
BP at goal on losartan/hydrochlorothiazide 100/25. Recheck BMP today.

## 2023-02-25 NOTE — Patient Instructions (Addendum)
We will recheck the PSA levels today.  We have sent in the refills of the tadalafil and sildenafil

## 2023-02-25 NOTE — Patient Instructions (Signed)
Thomas Ayala , Thank you for taking time to come for your Medicare Wellness Visit. I appreciate your ongoing commitment to your health goals. Please review the following plan we discussed and let me know if I can assist you in the future.   Referrals/Orders/Follow-Ups/Clinician Recommendations: Remember to ask your PCP for a recommendation for an eye doctor.  Keep up the good work.  This is a list of the screening recommended for you and due dates:  Health Maintenance  Topic Date Due   COVID-19 Vaccine (1) Never done   Hepatitis C Screening  Never done   Zoster (Shingles) Vaccine (1 of 2) Never done   Pneumonia Vaccine (1 of 1 - PCV) Never done   Flu Shot  Never done   Colon Cancer Screening  11/12/2023*   Medicare Annual Wellness Visit  02/25/2024   DTaP/Tdap/Td vaccine (3 - Td or Tdap) 05/04/2024   HPV Vaccine  Aged Out  *Topic was postponed. The date shown is not the original due date.    Advanced directives: (Provided) Advance directive discussed with you today. I have provided a copy for you to complete at home and have notarized. Once this is complete, please bring a copy in to our office so we can scan it into your chart.   Next Medicare Annual Wellness Visit scheduled for next year: No

## 2023-02-25 NOTE — Progress Notes (Signed)
Subjective:   Thomas Ayala is a 67 y.o. male who presents for an Initial Medicare Annual Wellness Visit.  Visit Complete: In person   Cardiac Risk Factors include: advanced age (>22men, >27 women);male gender;hypertension;Other (see comment), Risk factor comments: Prostate cancer     Objective:    Today's Vitals   02/25/23 1434  BP: 120/80  Pulse: 71  SpO2: 99%  Weight: 221 lb (100.2 kg)  Height: 5\' 8"  (1.727 m)   Body mass index is 33.6 kg/m.     02/25/2023    2:43 PM 05/21/2016    9:30 PM  Advanced Directives  Does Patient Have a Medical Advance Directive? No No  Would patient like information on creating a medical advance directive?  No - Patient declined    Current Medications (verified) Outpatient Encounter Medications as of 02/25/2023  Medication Sig   cetirizine (ZYRTEC) 10 MG tablet Take 1 tablet (10 mg total) by mouth daily.   ibuprofen (ADVIL) 200 MG tablet Take by mouth.   losartan-hydrochlorothiazide (HYZAAR) 100-25 MG tablet Take 1 tablet by mouth daily.   methocarbamol (ROBAXIN) 500 MG tablet Take 1 tablet (500 mg total) by mouth at bedtime as needed for muscle spasms.   montelukast (SINGULAIR) 10 MG tablet Take 1 tablet (10 mg total) by mouth at bedtime.   oxymetazoline (AFRIN NASAL SPRAY) 0.05 % nasal spray Place 1 spray into both nostrils 2 (two) times daily.   tadalafil (CIALIS) 20 MG tablet Take 0.5-1 tablets (10-20 mg total) by mouth every other day as needed for erectile dysfunction.   tamsulosin (FLOMAX) 0.4 MG CAPS capsule Take 1 capsule (0.4 mg total) by mouth daily.   TURMERIC PO See admin instructions.   Vitamin D, Ergocalciferol, (DRISDOL) 1.25 MG (50000 UNIT) CAPS capsule Take 1 capsule by mouth once a week.   No facility-administered encounter medications on file as of 02/25/2023.    Allergies (verified) Benazepril hcl   History: Past Medical History:  Diagnosis Date   Arthritis    ED (erectile dysfunction)    Gout     Hypertension    Prostate cancer (HCC)    History reviewed. No pertinent surgical history. Family History  Problem Relation Age of Onset   Dementia Mother    Lung cancer Father    Prostate cancer Brother    Social History   Socioeconomic History   Marital status: Married    Spouse name: Lelon Mast   Number of children: 1   Years of education: Not on file   Highest education level: Not on file  Occupational History   Occupation: machinic full time  Tobacco Use   Smoking status: Former    Current packs/day: 0.00    Types: Cigarettes    Quit date: 08/07/1991    Years since quitting: 31.5   Smokeless tobacco: Never  Vaping Use   Vaping status: Never Used  Substance and Sexual Activity   Alcohol use: Yes    Comment: rarly   Drug use: Never   Sexual activity: Not on file  Other Topics Concern   Not on file  Social History Narrative   Not on file   Social Determinants of Health   Financial Resource Strain: Low Risk  (02/25/2023)   Overall Financial Resource Strain (CARDIA)    Difficulty of Paying Living Expenses: Not hard at all  Food Insecurity: No Food Insecurity (02/25/2023)   Hunger Vital Sign    Worried About Running Out of Food in the Last Year: Never true  Ran Out of Food in the Last Year: Never true  Transportation Needs: No Transportation Needs (02/25/2023)   PRAPARE - Administrator, Civil Service (Medical): No    Lack of Transportation (Non-Medical): No  Physical Activity: Sufficiently Active (02/25/2023)   Exercise Vital Sign    Days of Exercise per Week: 3 days    Minutes of Exercise per Session: 90 min  Stress: No Stress Concern Present (02/25/2023)   [employer] of Occupational Health - Occupational Stress Questionnaire    Feeling of Stress : Not at all  Social Connections: Moderately Isolated (02/25/2023)   Social Connection and Isolation Panel [NHANES]    Frequency of Communication with Friends and Family: Twice a week    Frequency of  Social Gatherings with Friends and Family: Never    Attends Religious Services: More than 4 times per year    Active Member of [employer] or Organizations: No    Attends Engineer, structural: Never    Marital Status: Married    Tobacco Counseling Counseling given: Not Answered   Clinical Intake:  Pre-visit preparation completed: Yes  Pain : No/denies pain     BMI - recorded: 33.6 Nutritional Status: BMI > 30  Obese Diabetes: No  How often do you need to have someone help you when you read instructions, pamphlets, or other written materials from your doctor or pharmacy?: 1 - Never  Interpreter Needed?: No  Information entered by :: Mosie Angus, RMA   Activities of Daily Living    02/25/2023    2:38 PM  In your present state of health, do you have any difficulty performing the following activities:  Hearing? 0  Vision? 0  Difficulty concentrating or making decisions? 0  Walking or climbing stairs? 0  Dressing or bathing? 0  Doing errands, shopping? 0  Preparing Food and eating ? N  Using the Toilet? N  In the past six months, have you accidently leaked urine? N  Do you have problems with loss of bowel control? N  Managing your Medications? N  Managing your Finances? N  Housekeeping or managing your Housekeeping? N    Patient Care Team: Myrlene Broker, MD as PCP - General (Internal Medicine)  Indicate any recent Medical Services you may have received from other than [facility] providers in the past year (date may be approximate).     Assessment:   This is a routine wellness examination for [employer].  Hearing/Vision screen Hearing Screening - Comments:: Denies hearing difficulties   Vision Screening - Comments:: Wears eyeglasses.   Goals Addressed               This Visit's Progress     Patient Stated (pt-stated)        To get a good report on prostate.  Would like to lose 10 lbs       Depression Screen    02/25/2023    2:47 PM 11/12/2022     2:49 PM 12/14/2021    9:35 AM 09/11/2021   10:37 AM 07/30/2021    1:25 PM 10/13/2020    2:06 PM 08/15/2020    2:05 PM  PHQ 2/9 Scores  PHQ - 2 Score 0 0 0 0 2 0 0  PHQ- 9 Score 3 0 0 3 10 0     Fall Risk    02/25/2023    2:43 PM 11/12/2022    2:49 PM 07/30/2021    1:24 PM 10/13/2020    2:34 PM 10/13/2020  2:06 PM  Fall Risk   Falls in the past year? 0 0 0 0 0  Number falls in past yr: 0 0 0 0 0  Injury with Fall? 0 0 0 0 0  Risk for fall due to : No Fall Risks  No Fall Risks    Follow up Falls prevention discussed;Falls evaluation completed Falls evaluation completed Falls evaluation completed Falls evaluation completed     MEDICARE RISK AT HOME: Medicare Risk at Home Any stairs in or around the home?: No Home free of loose throw rugs in walkways, pet beds, electrical cords, etc?: Yes Adequate lighting in your home to reduce risk of falls?: Yes Life alert?: No Use of a cane, walker or w/c?: No Grab bars in the bathroom?: No Shower chair or bench in shower?: No Elevated toilet seat or a handicapped toilet?: No  TIMED UP AND GO:  Was the test performed? Yes  Length of time to ambulate 10 feet: 10 sec Gait steady and fast without use of assistive device    Cognitive Function:        02/25/2023    3:04 PM  6CIT Screen  What Year? 0 points  What month? 0 points  What time? 0 points  Count back from 20 0 points  Months in reverse 0 points  Repeat phrase 0 points  Total Score 0 points    Immunizations Immunization History  Administered Date(s) Administered   Tdap 08/28/2012, 05/04/2014    TDAP status: Up to date  Flu Vaccine status: Declined, Education has been provided regarding the importance of this vaccine but patient still declined. Advised may receive this vaccine at local pharmacy or Health Dept. Aware to provide a copy of the vaccination record if obtained from local pharmacy or Health Dept. Verbalized acceptance and understanding.  Pneumococcal vaccine  status: Declined,  Education has been provided regarding the importance of this vaccine but patient still declined. Advised may receive this vaccine at local pharmacy or Health Dept. Aware to provide a copy of the vaccination record if obtained from local pharmacy or Health Dept. Verbalized acceptance and understanding.   Covid-19 vaccine status: Declined, Education has been provided regarding the importance of this vaccine but patient still declined. Advised may receive this vaccine at local pharmacy or Health Dept.or vaccine clinic. Aware to provide a copy of the vaccination record if obtained from local pharmacy or Health Dept. Verbalized acceptance and understanding.  Qualifies for Shingles Vaccine? No   Zostavax completed No   Shingrix Completed?: No.    Education has been provided regarding the importance of this vaccine. Patient has been advised to call insurance company to determine out of pocket expense if they have not yet received this vaccine. Advised may also receive vaccine at local pharmacy or Health Dept. Verbalized acceptance and understanding.  Screening Tests Health Maintenance  Topic Date Due   COVID-19 Vaccine (1) Never done   Hepatitis C Screening  Never done   Zoster Vaccines- Shingrix (1 of 2) Never done   Pneumonia Vaccine 74+ Years old (1 of 1 - PCV) Never done   INFLUENZA VACCINE  Never done   Colonoscopy  11/12/2023 (Originally 12/17/2000)   Medicare Annual Wellness (AWV)  02/25/2024   DTaP/Tdap/Td (3 - Td or Tdap) 05/04/2024   HPV VACCINES  Aged Out    Health Maintenance  Health Maintenance Due  Topic Date Due   COVID-19 Vaccine (1) Never done   Hepatitis C Screening  Never done   Zoster  Vaccines- Shingrix (1 of 2) Never done   Pneumonia Vaccine 35+ Years old (1 of 1 - PCV) Never done   INFLUENZA VACCINE  Never done    Colorectal cancer screening: Type of screening: Cologuard. Completed 02/15/2023. Repeat every 3 years  Lung Cancer Screening: (Low Dose CT  Chest recommended if Age 70-80 years, 20 pack-year currently smoking OR have quit w/in 15years.) does not qualify.   Lung Cancer Screening Referral: N/A  Additional Screening:  Hepatitis C Screening: does qualify; Completed ND  Vision Screening: Recommended annual ophthalmology exams for early detection of glaucoma and other disorders of the eye. Is the patient up to date with their annual eye exam?  No  Who is the provider or what is the name of the office in which the patient attends annual eye exams? [facility] If pt is not established with a provider, would they like to be referred to a provider to establish care? Yes .   Dental Screening: Recommended annual dental exams for proper oral hygiene   Community Resource Referral / Chronic Care Management: CRR required this visit?  No   CCM required this visit?  No    Plan:     I have personally reviewed and noted the following in the patient's chart:   Medical and social history Use of alcohol, tobacco or illicit drugs  Current medications and supplements including opioid prescriptions. Patient is not currently taking opioid prescriptions. Functional ability and status Nutritional status Physical activity Advanced directives List of other physicians Hospitalizations, surgeries, and ER visits in previous 12 months Vitals Screenings to include cognitive, depression, and falls Referrals and appointments  In addition, I have reviewed and discussed with patient certain preventive protocols, quality metrics, and best practice recommendations. A written personalized care plan for preventive services as well as general preventive health recommendations were provided to patient.     Jhoselyn Ruffini L Azarya Oconnell, CMA   02/25/2023   After Visit Summary: (MyChart) Due to this being a telephonic visit, the after visit summary with patients personalized plan was offered to patient via MyChart   Nurse Notes: Patient declines most all vaccines.   He is due for a Hep C screening.  He is also due for an annual eye exam and would like a recommendation from PCP.  Patient had no other concerns to address today.

## 2023-02-28 ENCOUNTER — Telehealth: Payer: Self-pay | Admitting: Internal Medicine

## 2023-02-28 DIAGNOSIS — C61 Malignant neoplasm of prostate: Secondary | ICD-10-CM

## 2023-02-28 NOTE — Telephone Encounter (Signed)
Patient called stating that he would like the urologist referral that Dr. Okey Dupre had messaged him about.

## 2023-03-01 NOTE — Telephone Encounter (Signed)
Does he want to go back to his current urologist or see someone in town?

## 2023-03-03 NOTE — Telephone Encounter (Signed)
Referral done

## 2023-03-07 NOTE — Telephone Encounter (Signed)
Referral has been placed for patient and this location is in [place]

## 2023-03-10 DIAGNOSIS — C61 Malignant neoplasm of prostate: Secondary | ICD-10-CM | POA: Diagnosis not present

## 2023-03-10 DIAGNOSIS — Z191 Hormone sensitive malignancy status: Secondary | ICD-10-CM | POA: Diagnosis not present

## 2023-04-12 DIAGNOSIS — R9721 Rising PSA following treatment for malignant neoplasm of prostate: Secondary | ICD-10-CM | POA: Diagnosis not present

## 2023-04-12 DIAGNOSIS — C61 Malignant neoplasm of prostate: Secondary | ICD-10-CM | POA: Diagnosis not present

## 2023-05-10 DIAGNOSIS — C61 Malignant neoplasm of prostate: Secondary | ICD-10-CM | POA: Diagnosis not present

## 2023-05-12 DIAGNOSIS — C61 Malignant neoplasm of prostate: Secondary | ICD-10-CM | POA: Diagnosis not present

## 2023-05-12 DIAGNOSIS — Z191 Hormone sensitive malignancy status: Secondary | ICD-10-CM | POA: Diagnosis not present

## 2023-06-02 DIAGNOSIS — H524 Presbyopia: Secondary | ICD-10-CM | POA: Diagnosis not present

## 2023-06-16 DIAGNOSIS — R2230 Localized swelling, mass and lump, unspecified upper limb: Secondary | ICD-10-CM | POA: Insufficient documentation

## 2023-06-16 DIAGNOSIS — R2231 Localized swelling, mass and lump, right upper limb: Secondary | ICD-10-CM | POA: Diagnosis not present

## 2023-06-16 DIAGNOSIS — G5601 Carpal tunnel syndrome, right upper limb: Secondary | ICD-10-CM | POA: Diagnosis not present

## 2023-07-18 ENCOUNTER — Ambulatory Visit
Admission: EM | Admit: 2023-07-18 | Discharge: 2023-07-18 | Disposition: A | Discharge status: Home / Self Care | Payer: Medicare HMO | Attending: Physician Assistant | Admitting: Physician Assistant

## 2023-07-18 DIAGNOSIS — M109 Gout, unspecified: Secondary | ICD-10-CM

## 2023-07-18 MED ORDER — COLCHICINE 0.6 MG PO TABS: 0.6000 mg | ORAL_TABLET | Freq: Every day | ORAL | 0 refills | Status: DC

## 2023-07-18 MED ORDER — INDOMETHACIN 50 MG PO CAPS: 50.0000 mg | ORAL_CAPSULE | Freq: Two times a day (BID) | ORAL | 0 refills | Status: AC

## 2023-07-18 NOTE — ED Provider Notes (Incomplete)
 [facility]    CSN: [id number] Arrival date & time: 07/18/23  1509      History   Chief Complaint No chief complaint on file.   HPI Thomas Ayala is a 68 y.o. male.   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     Home Medications    Prior to Admission medications   Medication Sig Start Date End Date Taking? Authorizing Provider  cetirizine (ZYRTEC) 10 MG tablet Take 1 tablet (10 mg total) by mouth daily. 07/30/21   Janeece Agee, NP  ibuprofen (ADVIL) 200 MG tablet Take by mouth.    [provider]  losartan-hydrochlorothiazide (HYZAAR) 100-25 MG tablet Take 1 tablet by mouth daily. 11/12/22   Myrlene Broker, MD  methocarbamol (ROBAXIN) 500 MG tablet Take 1 tablet (500 mg total) by mouth at bedtime as needed for muscle spasms. 10/13/20   Janeece Agee, NP  montelukast (SINGULAIR) 10 MG tablet Take 1 tablet (10 mg total) by mouth at bedtime. 07/30/21   Janeece Agee, NP  oxymetazoline (AFRIN NASAL SPRAY) 0.05 % nasal spray Place 1 spray into both nostrils 2 (two) times daily. 07/30/21   Janeece Agee, NP  sildenafil (VIAGRA) 100 MG tablet Take 1 tablet (100 mg total) by mouth daily as needed for erectile dysfunction. 02/25/23   Myrlene Broker, MD  tadalafil (CIALIS) 20 MG tablet Take 0.5-1 tablets (10-20 mg total) by mouth every other day as needed for erectile dysfunction.  02/25/23   Myrlene Broker, MD  tamsulosin (FLOMAX) 0.4 MG CAPS capsule Take 1 capsule (0.4 mg total) by mouth daily. 05/20/22   Zenia Resides, MD  TURMERIC PO See admin instructions.    [provider]  Vitamin D, Ergocalciferol, (DRISDOL) 1.25 MG (50000 UNIT) CAPS capsule Take 1 capsule by mouth once a week. 09/28/13   [provider]    Family History Family History  Problem Relation Age of Onset   Dementia Mother    Lung cancer Father    Prostate cancer Brother     Social History Social History   Tobacco Use   Smoking status: Former    Current packs/day: 0.00    Types: Cigarettes    Quit date: 08/07/1991    Years since quitting: 31.9   Smokeless tobacco: Never  Vaping Use   Vaping status: Never Used  Substance Use Topics   Alcohol use: Yes    Comment: rarly   Drug use: Never     Allergies   Benazepril hcl   Review of Systems Review of Systems  Constitutional:  Negative for chills and fever.  Eyes:  Negative for discharge and redness.  Respiratory:  Negative for shortness of breath.   Musculoskeletal:  Positive for arthralgias.  Neurological:  Negative for numbness.     Physical Exam Triage Vital Signs ED Triage Vitals  Encounter  Vitals Group     BP      Systolic BP Percentile      Diastolic BP Percentile      Pulse      Resp      Temp      Temp src      SpO2      Weight      Height      Head Circumference      Peak Flow      Pain Score      Pain Loc      Pain Education      Exclude from Growth Chart    No data found.  Updated Vital Signs There were no vitals taken for this visit.  Visual Acuity Right Eye Distance:   Left Eye Distance:   Bilateral Distance:    Right Eye Near:   Left Eye Near:    Bilateral Near:     Physical Exam Vitals and nursing note reviewed.  Constitutional:      General: He is not in acute distress.    Appearance: Normal appearance. He is not ill-appearing.  HENT:     Head:  Normocephalic and atraumatic.  Eyes:     Conjunctiva/sclera: Conjunctivae normal.  Cardiovascular:     Rate and Rhythm: Normal rate.  Pulmonary:     Effort: Pulmonary effort is normal.  Neurological:     Mental Status: He is alert.  Psychiatric:        Mood and Affect: Mood normal.        Behavior: Behavior normal.        Thought Content: Thought content normal.      UC Treatments / Results  Labs (all labs ordered are listed, but only abnormal results are displayed) Labs Reviewed - No data to display  EKG   Radiology No results found.  Procedures Procedures (including critical care time)  Medications Ordered in UC Medications - No data to display  Initial Impression / Assessment and Plan / UC Course  I have reviewed the triage vital signs and the nursing notes.  Pertinent labs & imaging results that were available during my care of the patient were reviewed by me and considered in my medical decision making (see chart for details).     *** Final Clinical Impressions(s) / UC Diagnoses   Final diagnoses:  None   Discharge Instructions   None    ED Prescriptions   None    PDMP not reviewed this encounter.

## 2023-07-18 NOTE — ED Triage Notes (Signed)
 Patient presents to [facility] for left knee and right ankle since Friday. No OTC meds for pain.

## 2023-07-27 ENCOUNTER — Other Ambulatory Visit: Payer: Self-pay

## 2023-07-27 DIAGNOSIS — M1A0211 Idiopathic chronic gout, right elbow, with tophus (tophi): Secondary | ICD-10-CM | POA: Diagnosis not present

## 2023-07-27 DIAGNOSIS — G5601 Carpal tunnel syndrome, right upper limb: Secondary | ICD-10-CM | POA: Diagnosis not present

## 2023-07-27 DIAGNOSIS — R2231 Localized swelling, mass and lump, right upper limb: Secondary | ICD-10-CM | POA: Diagnosis not present

## 2023-07-29 LAB — SURGICAL PATHOLOGY

## 2023-08-05 DIAGNOSIS — M25621 Stiffness of right elbow, not elsewhere classified: Secondary | ICD-10-CM | POA: Diagnosis not present

## 2023-08-12 DIAGNOSIS — M25641 Stiffness of right hand, not elsewhere classified: Secondary | ICD-10-CM | POA: Diagnosis not present

## 2023-08-15 DIAGNOSIS — C61 Malignant neoplasm of prostate: Secondary | ICD-10-CM | POA: Diagnosis not present

## 2023-08-25 DIAGNOSIS — C61 Malignant neoplasm of prostate: Secondary | ICD-10-CM | POA: Diagnosis not present

## 2023-08-25 DIAGNOSIS — Z87891 Personal history of nicotine dependence: Secondary | ICD-10-CM | POA: Diagnosis not present

## 2023-08-25 DIAGNOSIS — Z191 Hormone sensitive malignancy status: Secondary | ICD-10-CM | POA: Diagnosis not present

## 2023-08-25 DIAGNOSIS — R9721 Rising PSA following treatment for malignant neoplasm of prostate: Secondary | ICD-10-CM | POA: Diagnosis not present

## 2023-10-04 DIAGNOSIS — C61 Malignant neoplasm of prostate: Secondary | ICD-10-CM | POA: Diagnosis not present

## 2023-10-14 ENCOUNTER — Telehealth: Payer: Self-pay

## 2023-10-14 NOTE — Telephone Encounter (Signed)
 This patient is appearing on a report for being at risk of failing the adherence measure for hypertension (ACEi/ARB) medications this calendar year.   Medication: losartan -hydrochlorothiazide  100-25 mg Last fill date: 10/14/23 for 90 day supply  Insurance report was not up to date. No action needed at this time.   Abelina Abide, PharmD PGY1 Pharmacy Resident 10/14/2023 5:16 PM

## 2023-12-28 ENCOUNTER — Telehealth: Payer: Self-pay | Admitting: Internal Medicine

## 2023-12-28 NOTE — Telephone Encounter (Signed)
**Note De-identified  [place] Obfuscation** Please advise 

## 2023-12-28 NOTE — Telephone Encounter (Signed)
 Copied from CRM #[id number]. Topic: Clinical - Medical Advice >> Dec 28, 2023  3:45 PM Franky GRADE wrote: Reason for CRM: Patient is having flare ups of gout and would like to know if a prescription can be sent in to help. He hasn't been able to come into the office for an appointment due to work and not having the funds to pay for a co-pay.

## 2023-12-29 NOTE — Telephone Encounter (Signed)
 Use colchicine  that was prescribed previously.  Use ibuprofen OTC. Schedule office visit with any available provider.  Thanks

## 2024-01-02 DIAGNOSIS — C61 Malignant neoplasm of prostate: Secondary | ICD-10-CM | POA: Diagnosis not present

## 2024-01-03 NOTE — Progress Notes (Signed)
 Radiation Oncology follow up Note   Diagnosis Thomas Ayala is a 68 y.o. male with originally low risk prostate cancer, cT2a, PSA 4.82, GS 6, s/p biopsy most recently in 2016, rising PSA on finasteride, declined biopsies, DRE as part of AS, PSMA imaging 04/22/23 demonstrating disease limited to the prostate, deferring conventional treatment in favor of dietary/lifestyle modifications for the time being, continued rise in PSA.  History of Present Illness  Thomas Ayala is a 68 y.o. male seen today for an opinion regarding the use of radiation therapy in the treatment of his prostate cancer.    Latest Reference Range & Units 08/15/23 08:11 10/04/23 08:14 01/02/24 08:38  PSA Ultrasensitive 0.000 - 4.000 ng/mL 27.500 (H) 22.600 (H) 27.900 (H)  (H): Data is abnormally high  Today, he is doing ok. He is concerned about recent gout flare, lack of exercise, poor diet contributing to recent rise. No new bony pain. No GI/GU changes.   Oncology History   No problem history exists.    Past Medical History:  Diagnosis Date  . Acid reflux   . Chronic kidney disease   . Gout   . Hypertension   . Prostate cancer (*)     History of prior irradiation: no History of prior systemic anti-cancer therapy: no History of collagen vascular disease: no Implanted electronic device (pacer/defib/nerve stimulator): no  Past Surgical History:  Procedure Laterality Date  . Carpal tunnel release Right 07/27/2023  . Colonoscopy N/A 05/24/2010   normal  . Elbow bursa surgery Right 07/27/2023  . Prostate biopsy  05/25/2011   was told that cancer was found and treatment recommended        Allergies  Allergen Reactions  . Benazepril  Hcl Swelling     Current Home Medications  Medication Sig Last Dose  amLODIPine  besylate (NORVASC) 10 mg tablet 1 tablet Patient not taking: Reported on 01/05/2024   amlodipine -benazepril  (LOTREL) 10-20 MG per capsule Take one capsule by mouth every morning.    colchicine  0.6 mg tablet Take two tablets (1.2 mg dose) by mouth daily.   ergocalciferol  (VITAMIN D2) 50,000 units CAPS capsule Take 1 capsule (50,000 Units total) by mouth once a week. Patient not taking: Reported on 01/05/2024   hydrochlorothiazide  (HYDRODIURIL ) 25 mg tablet Take one tablet (25 mg dose) by mouth daily.   ibuprofen (MOTRIN) 200 mg tablet Take two tablets (400 mg dose) by mouth every 6 (six) hours as needed for Pain.   methocarbamol  (ROBAXIN ) 500 MG tablet Take one tablet (500 mg dose) by mouth.   Misc Natural Products (PUMPKIN SEED OIL PO) Take 1 capsule by mouth every morning. Patient not taking: Reported on 01/05/2024   NON FORMULARY Take 2 tablets by mouth daily. Gout Out   Saw Palmetto, Serenoa repens, (SAW PALMETTO PO) Take by mouth every morning.    sildenafil  citrate (REVATIO ) 20 mg tablet TAKE 2-5 TABLETS BY MOUTH DAILY AS NEEDED   tadalafil  (CIALIS ) 20 MG tablet TAKE 1 TABLET(20 MG) BY MOUTH DAILY AS NEEDED FOR ERECTILE DYSFUNCTION   Turmeric 500 MG CAPS    UNABLE TO FIND Take by mouth daily. Med Name: Unknown blood pressure medication - bp med combined with diuretic Patient not taking: Reported on 01/05/2024   VITAMIN D  PO Take 6,000 Units by mouth daily.     Scheduled Meds: Continuous Infusions: PRN Meds:.   I have personally reviewed and reconciled medications as listed above.    Family History  Problem Relation Age of Onset  . Cancer  Father        lung  . Cancer Brother        prostate     Social History   Socioeconomic History  . Marital status: Married    Spouse name: Lucie  . Number of children: 1  Tobacco Use  . Smoking status: Former    Current packs/day: 0.00    Average packs/day: 0.5 packs/day for 8.0 years (4.0 ttl pk-yrs)    Types: Cigarettes    Start date: 08/28/1984    Quit date: 08/28/1992    Years since quitting: 31.3  . Smokeless tobacco: Never  Substance and Sexual Activity  . Alcohol use: Not Currently  . Drug use: No  .  Sexual activity: Yes    Partners: Female      Review of Systems  A 10-point review of systems was obtained, with pertinent positives and negatives documented per the HPI.  For a complete documented review of systems, please see the Radiation Oncology intake form.  Physical Exam Vitals:   01/05/24 1105  BP: 129/84  BP Location: Right Upper Arm  Patient Position: Sitting  Pulse: 89  Resp: 16  Temp: 98.4 F (36.9 C)  TempSrc: Temporal  SpO2: 98%  Weight: 99.2 kg (218 lb 9.6 oz)  Height: 1.727 m (5' 8)     Pain Score   01/05/24 1105  PainSc:   5  PainLoc: Hand     ECOG: (0) Fully active, able to carry on all predisease performance without restriction  Imaging Pertinent imaging, described in the HPI and available in the EMR/PACS were personally reviewed, as detailed above.  Pulmonary Function Testing No results found for: FEV1, FVC, FEV1FVC, TLC, DLCO  Assessment   68 y.o. male with originally low risk prostate cancer, cT2a, PSA 4.82, GS 6, s/p biopsy most recently in 2016, rising PSA on finasteride, declined biopsies, DRE as part of AS, PSMA imaging 04/22/23 demonstrating disease limited to the prostate, deferring conventional treatment in favor of dietary/lifestyle modifications for the time being, continued rise in PSA.  Plan We discussed our conerns about waiting for definitive treatment of his prostate cancer. Again discussed the logistics, side effects of radiation. Also discussed surgery as an option. Patient wanted to wait until PSA drawn with PCP in 02/2024 to make a decision. We will follow up with him at that time.   The above recommendations are per NCCN guidelines, UpToDate, and/or [email or web address].  I spent 30 total minutes on this visit in preparation, review of records, examination, counseling, placing orders, consultation with other providers, documentation, and coordination of care.  Prentice Conroy,  MD [email or web address] [phone]

## 2024-01-05 DIAGNOSIS — Z191 Hormone sensitive malignancy status: Secondary | ICD-10-CM | POA: Diagnosis not present

## 2024-01-05 DIAGNOSIS — C61 Malignant neoplasm of prostate: Secondary | ICD-10-CM | POA: Diagnosis not present

## 2024-01-08 ENCOUNTER — Ambulatory Visit: Admission: EM | Admit: 2024-01-08 | Discharge: 2024-01-08 | Disposition: A | Discharge status: Home / Self Care

## 2024-01-08 ENCOUNTER — Encounter: Payer: Self-pay | Admitting: Emergency Medicine

## 2024-01-08 DIAGNOSIS — M2550 Pain in unspecified joint: Secondary | ICD-10-CM | POA: Diagnosis not present

## 2024-01-08 DIAGNOSIS — C61 Malignant neoplasm of prostate: Secondary | ICD-10-CM | POA: Insufficient documentation

## 2024-01-08 DIAGNOSIS — N529 Male erectile dysfunction, unspecified: Secondary | ICD-10-CM | POA: Insufficient documentation

## 2024-01-08 DIAGNOSIS — G4733 Obstructive sleep apnea (adult) (pediatric): Secondary | ICD-10-CM | POA: Insufficient documentation

## 2024-01-08 MED ORDER — INDOMETHACIN 50 MG PO CAPS: 50.0000 mg | ORAL_CAPSULE | Freq: Three times a day (TID) | ORAL | 0 refills | Status: AC

## 2024-01-08 NOTE — Discharge Instructions (Addendum)
 You were seen today for joint pain that is most likely related to a gout flare, although other causes of joint pain are also being considered. Blood work was ordered to check your uric acid level as well as markers for other conditions that can cause joint pain. You were prescribed indomethacin  to help manage pain and inflammation during this flare. Take this medication exactly as directed, preferably with food, to help prevent stomach upset.  At home, rest the affected joints as much as possible, and you may use ice packs for 15-20 minutes at a time to reduce pain and swelling. Staying well hydrated and avoiding foods that may trigger gout, such as red meat, seafood, alcohol, and tomatoes if you notice a connection, may help prevent flares.  Follow up with your primary care provider for further evaluation of your lab results and to discuss long-term management options if your flares continue. Go to the emergency department if you develop sudden severe swelling, fever, redness spreading around the joints, inability to bear weight, or if your pain becomes unmanageable with medication.

## 2024-01-08 NOTE — ED Provider Notes (Signed)
 [facility]    CSN: [id number] Arrival date & time: 01/08/24  1218      History   Chief Complaint Chief Complaint  Patient presents with   Knee Pain    HPI Thomas Ayala is a 68 y.o. male.   Discussed the use of AI scribe software for clinical note transcription with the patient, who gave verbal consent to proceed.   Patient with a history of gout and recent prostate cancer presents with concerns of a gout flare affecting multiple joints, including knees, thumb, and potentially other sites. The patient reports symptoms began on Monday, with initial improvement on Tuesday, followed by worsening on Friday after work.  The current flare started feeling tender after a gym session on Friday, with symptoms becoming severe yesterday, prompting today's visit. The patient describes his knees as tender and red, and his left thumb as swollen. He reports that his gout affects various joints in his body. The patient believes tomatoes might be triggering his gout flares.  The patient had wrist surgery in March and reports experiencing frequent gout flares since then. He reports having taken indomethacin  in the past, which provided some relief.  The following portions of the patient's history were reviewed and updated as appropriate: allergies, current medications, past family history, past medical history, past social history, past surgical history, and problem list.      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 No pertinent surgical history.     Home Medications    Prior to Admission medications   Medication Sig Start Date End Date Taking? Authorizing Provider  hydrochlorothiazide  (HYDRODIURIL ) 25 MG tablet Take 25 mg by mouth daily. 09/07/18  Yes [provider]  indomethacin  (INDOCIN ) 50 MG capsule Take 1 capsule (50 mg total) by mouth 3 (three) times daily with meals for 5 days. Take with food to avoid stomach upset. Do not take any additional NSAIDs while on this. You may take tylenol  in addition to this if needed for extra pain relief. 01/08/24 01/13/24 Yes Iola Lukes, FNP  losartan -hydrochlorothiazide  (HYZAAR) 100-25 MG tablet Take 1 tablet by mouth daily. 11/12/22   Rollene Almarie DELENA, MD  oxymetazoline  EVER NASAL SPRAY) 0.05 % nasal spray Place 1 spray into both nostrils 2 (two) times daily. 07/30/21   Kip Ade, NP  sildenafil  (REVATIO ) 20 MG tablet Take 20 mg by mouth as directed.    [provider]  sildenafil  (VIAGRA ) 100 MG tablet Take 1 tablet (100 mg total) by mouth daily as needed for erectile dysfunction. 02/25/23   Rollene Almarie DELENA, MD  tadalafil  (CIALIS ) 20 MG tablet Take 0.5-1 tablets (10-20 mg total) by mouth every other day  as needed for erectile dysfunction. 02/25/23   Rollene Almarie LABOR, MD  tamsulosin  (FLOMAX ) 0.4 MG CAPS capsule Take 1 capsule (0.4 mg total) by mouth daily. 05/20/22   Banister, Pamela K, MD  TURMERIC PO See admin instructions.    [provider]  Vitamin D , Ergocalciferol , (DRISDOL) 1.25 MG (50000 UNIT) CAPS capsule Take 1 capsule by mouth once a week. 09/28/13   [provider]    Family  History Family History  Problem Relation Age of Onset   Dementia Mother    Lung cancer Father    Prostate cancer Brother     Social History Social History   Tobacco Use   Smoking status: Former    Current packs/day: 0.00    Types: Cigarettes    Quit date: 08/07/1991    Years since quitting: 32.4   Smokeless tobacco: Never  Vaping Use   Vaping status: Never Used  Substance Use Topics   Alcohol use: Yes    Comment: rarly   Drug use: Never     Allergies   Benazepril  hcl   Review of Systems Review of Systems  Constitutional:  Negative for fever.  Musculoskeletal:  Positive for arthralgias (bilateral knees and left thumb) and joint swelling. Negative for gait problem.  Neurological:  Negative for weakness and numbness.  All other systems reviewed and are negative.    Physical Exam Triage Vital Signs ED Triage Vitals  Encounter Vitals Group     BP 01/08/24 1240 (!) 137/93     Girls Systolic BP Percentile --      Girls Diastolic BP Percentile --      Boys Systolic BP Percentile --      Boys Diastolic BP Percentile --      Pulse Rate 01/08/24 1240 68     Resp 01/08/24 1240 20     Temp 01/08/24 1240 98.3 F (36.8 C)     Temp Source 01/08/24 1240 Oral     SpO2 01/08/24 1240 97 %     Weight --      Height --      Head Circumference --      Peak Flow --      Pain Score 01/08/24 1241 7     Pain Loc --      Pain Education --      Exclude from Growth Chart --    No data found.  Updated Vital Signs BP (!) 137/93 (BP Location: Left Arm)   Pulse 68   Temp 98.3 F (36.8 C) (Oral)   Resp 20   SpO2 97%   Visual Acuity Right Eye Distance:   Left Eye Distance:   Bilateral Distance:    Right Eye Near:   Left Eye Near:    Bilateral Near:     Physical Exam Vitals reviewed.  Constitutional:      General: He is awake. He is not in acute distress.    Appearance: Normal appearance. He is well-developed. He is not ill-appearing, toxic-appearing or diaphoretic.   HENT:     Head: Normocephalic.     Right Ear: Hearing normal.     Left Ear: Hearing normal.     Nose: Nose normal.     Mouth/Throat:     Mouth: Mucous membranes are moist.  Eyes:     General: Vision grossly intact.     Conjunctiva/sclera: Conjunctivae normal.  Cardiovascular:     Rate and Rhythm: Normal rate and regular rhythm.     Heart sounds:  Normal heart sounds.  Pulmonary:     Effort: Pulmonary effort is normal.     Breath sounds: Normal breath sounds and air entry.  Musculoskeletal:        General: Normal range of motion.       Hands:     Cervical back: Full passive range of motion without pain, normal range of motion and neck supple.       Legs:     Comments: Mild swelling is noted in the right thumb, though the patient maintains full range of motion, normal strength, intact sensation, and brisk capillary refill. There is no deformity, significant tenderness, or erythema present. Examination of both knees reveals tenderness without visible swelling, effusion, deformity, or limitation in range of motion. Strength and sensation are intact bilaterally, with no neurologic or functional deficits observed.   Skin:    General: Skin is warm and dry.  Neurological:     General: No focal deficit present.     Mental Status: He is alert and oriented to person, place, and time.  Psychiatric:        Speech: Speech normal.        Behavior: Behavior is cooperative.      UC Treatments / Results  Labs (all labs ordered are listed, but only abnormal results are displayed) Labs Reviewed  C-REACTIVE PROTEIN  SEDIMENTATION RATE  CK  URIC ACID  RHEUMATOID FACTOR    EKG   Radiology No results found.  Procedures Procedures (including critical care time)  Medications Ordered in UC Medications - No data to display  Initial Impression / Assessment and Plan / UC Course  I have reviewed the triage vital signs and the nursing notes.  Pertinent labs & imaging results that were  available during my care of the patient were reviewed by me and considered in my medical decision making (see chart for details).     The patient presents with pain in multiple joints and suspects a gout flare. Symptoms began on Monday, improved briefly on Tuesday, and then worsened again on Friday. He has a history of mildly elevated uric acid at 8.0 in June 2024 and reports frequent flares since undergoing wrist surgery in March, involving various joints including the knees and hands. The current flare has persisted for about three weeks with notable worsening yesterday, which he associates with tomato consumption. Based on history and clinical presentation, acute gout flare is most likely, though other causes of polyarthralgia are also being considered. He was prescribed indomethacin  for acute flare management, consistent with his preference over colchicine  or steroids. Blood work including uric acid, rheumatoid factor, CK, CRP, and ESR was ordered to further evaluate for gout and alternative causes. He was advised to follow up with his primary care provider for ongoing management and to seek emergency care if he develops severe swelling, fever, inability to bear weight, or new systemic symptoms.  Today's evaluation has revealed no signs of a dangerous process. Discussed diagnosis with patient and/or guardian. Patient and/or guardian aware of their diagnosis, possible red flag symptoms to watch out for and need for close follow up. Patient and/or guardian understands verbal and written discharge instructions. Patient and/or guardian comfortable with plan and disposition.  Patient and/or guardian has a clear mental status at this time, good insight into illness (after discussion and teaching) and has clear judgment to make decisions regarding their care  Documentation was completed with the aid of voice recognition software. Transcription may contain typographical errors.   Final  Clinical Impressions(s)  / UC Diagnoses   Final diagnoses:  Pain in joint, multiple sites  Polyarthralgia     Discharge Instructions      You were seen today for joint pain that is most likely related to a gout flare, although other causes of joint pain are also being considered. Blood work was ordered to check your uric acid level as well as markers for other conditions that can cause joint pain. You were prescribed indomethacin  to help manage pain and inflammation during this flare. Take this medication exactly as directed, preferably with food, to help prevent stomach upset.  At home, rest the affected joints as much as possible, and you may use ice packs for 15-20 minutes at a time to reduce pain and swelling. Staying well hydrated and avoiding foods that may trigger gout, such as red meat, seafood, alcohol, and tomatoes if you notice a connection, may help prevent flares.  Follow up with your primary care provider for further evaluation of your lab results and to discuss long-term management options if your flares continue. Go to the emergency department if you develop sudden severe swelling, fever, redness spreading around the joints, inability to bear weight, or if your pain becomes unmanageable with medication.      ED Prescriptions     Medication Sig Dispense Auth. Provider   indomethacin  (INDOCIN ) 50 MG capsule Take 1 capsule (50 mg total) by mouth 3 (three) times daily with meals for 5 days. Take with food to avoid stomach upset. Do not take any additional NSAIDs while on this. You may take tylenol  in addition to this if needed for extra pain relief. 15 capsule Iola Lukes, FNP      PDMP not reviewed this encounter.   Iola Lukes, [place] 01/08/24 [phone]

## 2024-01-08 NOTE — ED Triage Notes (Signed)
 Pt c/o bil knee, right thumb and right ankle pain off and on for several days   Pt has hx of gout

## 2024-01-10 ENCOUNTER — Other Ambulatory Visit: Payer: Self-pay | Admitting: Internal Medicine

## 2024-01-10 LAB — C-REACTIVE PROTEIN: CRP: 8 mg/L (ref 0–10)

## 2024-01-10 LAB — CK: Total CK: 372 U/L — ABNORMAL HIGH (ref 41–331)

## 2024-01-10 LAB — RHEUMATOID FACTOR: Rheumatoid fact SerPl-aCnc: <10 [IU]/mL (ref <14.0)

## 2024-01-10 LAB — SEDIMENTATION RATE

## 2024-01-10 LAB — URIC ACID: Uric Acid: 8.8 mg/dL — ABNORMAL HIGH (ref 3.8–8.4)

## 2024-01-11 ENCOUNTER — Ambulatory Visit (HOSPITAL_COMMUNITY): Payer: Self-pay

## 2024-02-22 ENCOUNTER — Emergency Department (HOSPITAL_BASED_OUTPATIENT_CLINIC_OR_DEPARTMENT_OTHER)
Admission: EM | Admit: 2024-02-22 | Discharge: 2024-02-22 | Disposition: A | Discharge status: Home / Self Care | Attending: Emergency Medicine | Admitting: Emergency Medicine

## 2024-02-22 ENCOUNTER — Other Ambulatory Visit: Payer: Self-pay

## 2024-02-22 ENCOUNTER — Encounter (HOSPITAL_BASED_OUTPATIENT_CLINIC_OR_DEPARTMENT_OTHER): Payer: Self-pay | Admitting: Emergency Medicine

## 2024-02-22 DIAGNOSIS — M109 Gout, unspecified: Secondary | ICD-10-CM | POA: Diagnosis not present

## 2024-02-22 DIAGNOSIS — M10061 Idiopathic gout, right knee: Secondary | ICD-10-CM | POA: Diagnosis not present

## 2024-02-22 DIAGNOSIS — M25561 Pain in right knee: Secondary | ICD-10-CM | POA: Diagnosis present

## 2024-02-22 HISTORY — DX: Essential (primary) hypertension: I10

## 2024-02-22 HISTORY — DX: Malignant (primary) neoplasm, unspecified: C80.1

## 2024-02-22 HISTORY — DX: Gout, unspecified: M10.9

## 2024-02-22 MED ORDER — INDOMETHACIN 25 MG PO CAPS
25.0000 mg | ORAL_CAPSULE | Freq: Three times a day (TID) | ORAL | 0 refills | Status: DC | PRN
  Filled 2024-02-22: qty 30, 10d supply, fill #0

## 2024-02-23 ENCOUNTER — Encounter: Payer: Self-pay | Admitting: Emergency Medicine

## 2024-02-27 ENCOUNTER — Ambulatory Visit (INDEPENDENT_AMBULATORY_CARE_PROVIDER_SITE_OTHER): Payer: Medicare HMO

## 2024-02-27 VITALS — BP 132/72 | HR 62 | Ht 65.0 in | Wt 214.2 lb

## 2024-02-27 DIAGNOSIS — Z0000 Encounter for general adult medical examination without abnormal findings: Secondary | ICD-10-CM

## 2024-02-27 NOTE — Progress Notes (Addendum)
 Subjective:  Please attest and cosign this visit due to patients primary care provider not being in the office at the time the visit was completed.  ([name])   Thomas Ayala is a 68 y.o. who presents for a Medicare Wellness preventive visit.  As a reminder, Annual Wellness Visits don't include a physical exam, and some assessments may be limited, especially if this visit is performed virtually. We may recommend an in-person follow-up visit with your provider if needed.  Visit Complete: Virtual I connected with  Thomas Ayala on 02/27/24 by a audio enabled telemedicine application and verified that I am speaking with the correct person using two identifiers.  Patient Location: Home  Provider Location: Office/Clinic  I discussed the limitations of evaluation and management by telemedicine. The patient expressed understanding and agreed to proceed.  Vital Signs: Because this visit was a virtual/telehealth visit, some criteria may be missing or patient reported. Any vitals not documented were not able to be obtained and vitals that have been documented are patient reported.  Persons Participating in Visit: Patient.  AWV Questionnaire: No: Patient Medicare AWV questionnaire was not completed prior to this visit.  Cardiac Risk Factors include: advanced age (>39men, >63 women);hypertension;obesity (BMI >30kg/m2);male gender     Objective:    Today's Vitals   02/27/24 1238  BP: 132/72  Pulse: 62  SpO2: 98%  Weight: 214 lb 3.2 oz (97.2 kg)  Height: 5' 5 (1.651 m)   Body mass index is 35.64 kg/m.     02/27/2024   12:38 PM 02/22/2024    3:06 PM 02/25/2023    2:43 PM 05/21/2016    9:30 PM  Advanced Directives  Does Patient Have a Medical Advance Directive? No No No No   Would patient like information on creating a medical advance directive? No - Patient declined   No - Patient declined      Data saved with a previous flowsheet row definition    Current  Medications (verified) Outpatient Encounter Medications as of 02/27/2024  Medication Sig   hydrochlorothiazide  (HYDRODIURIL ) 25 MG tablet Take 25 mg by mouth daily.   indomethacin  (INDOCIN ) 25 MG capsule Take 1 capsule (25 mg total) by mouth 3 (three) times daily as needed.   losartan -hydrochlorothiazide  (HYZAAR) 100-25 MG tablet TAKE 1 TABLET BY MOUTH DAILY   Melatonin 10 MG TABS Take 10 mg by mouth at bedtime. As needed for sleep   oxymetazoline  (AFRIN NASAL SPRAY) 0.05 % nasal spray Place 1 spray into both nostrils 2 (two) times daily.   sildenafil  (REVATIO ) 20 MG tablet Take 20 mg by mouth as directed.   sildenafil  (VIAGRA ) 100 MG tablet Take 1 tablet (100 mg total) by mouth daily as needed for erectile dysfunction.   tadalafil  (CIALIS ) 20 MG tablet Take 0.5-1 tablets (10-20 mg total) by mouth every other day as needed for erectile dysfunction.   tamsulosin  (FLOMAX ) 0.4 MG CAPS capsule Take 1 capsule (0.4 mg total) by mouth daily.   TURMERIC PO See admin instructions.   Vitamin D , Ergocalciferol , (DRISDOL) 1.25 MG (50000 UNIT) CAPS capsule Take 1 capsule by mouth once a week.   No facility-administered encounter medications on file as of 02/27/2024.    Allergies (verified) Benazepril  hcl   History: Past Medical History:  Diagnosis Date   Arthritis    Cancer [facility])    ED (erectile dysfunction)    Gout    Gout    Hypertension    Prostate cancer (HCC)  History reviewed. No pertinent surgical history. Family History  Problem Relation Age of Onset   Dementia Mother    Lung cancer Father    Prostate cancer Brother    Social History   Socioeconomic History   Marital status: Married    Spouse name: Thomas Ayala   Number of children: 1   Years of education: Not on file   Highest education level: Not on file  Occupational History   Occupation: machinic full time  Tobacco Use   Smoking status: Never   Smokeless tobacco: Never  Vaping Use   Vaping status: Never Used   Substance and Sexual Activity   Alcohol use: Yes    Alcohol/week: 1.0 standard drink of alcohol    Types: 1 Cans of beer per week    Comment: occasionally   Drug use: Not Currently   Sexual activity: Yes  Other Topics Concern   Not on file  Social History Narrative   Married   Social Drivers of Health   Financial Resource Strain: Low Risk  (02/27/2024)   Overall Financial Resource Strain (CARDIA)    Difficulty of Paying Living Expenses: Not hard at all  Food Insecurity: No Food Insecurity (02/27/2024)   Hunger Vital Sign    Worried About Running Out of Food in the Last Year: Never true    Ran Out of Food in the Last Year: Never true  Transportation Needs: No Transportation Needs (02/27/2024)   PRAPARE - Administrator, Civil Service (Medical): No    Lack of Transportation (Non-Medical): No  Physical Activity: Sufficiently Active (02/27/2024)   Exercise Vital Sign    Days of Exercise per Week: 3 days    Minutes of Exercise per Session: 60 min  Stress: No Stress Concern Present (02/27/2024)   [employer] of Occupational Health - Occupational Stress Questionnaire    Feeling of Stress: Not at all  Social Connections: Moderately Isolated (02/27/2024)   Social Connection and Isolation Panel    Frequency of Communication with Friends and Family: Twice a week    Frequency of Social Gatherings with Friends and Family: Never    Attends Religious Services: More than 4 times per year    Active Member of [employer] or Organizations: No    Attends Engineer, structural: Never    Marital Status: Married    Tobacco Counseling Counseling given: Not Answered    Clinical Intake:  Pre-visit preparation completed: Yes  Pain : No/denies pain     BMI - recorded: 35.64 Nutritional Status: BMI > 30  Obese Nutritional Risks: None Diabetes: No  Lab Results  Component Value Date   HGBA1C 5.3 11/12/2022   HGBA1C 5.6 12/14/2021   HGBA1C 5.6 10/13/2020     How  often do you need to have someone help you when you read instructions, pamphlets, or other written materials from your doctor or pharmacy?: 1 - Never  Interpreter Needed?: No  Information entered by :: Verdie Saba, CMA   Activities of Daily Living     02/27/2024   12:40 PM  In your present state of health, do you have any difficulty performing the following activities:  Hearing? 0  Vision? 0  Difficulty concentrating or making decisions? 0  Walking or climbing stairs? 0  Dressing or bathing? 0  Doing errands, shopping? 0  Preparing Food and eating ? N  Using the Toilet? N  In the past six months, have you accidently leaked urine? N  Do you have problems with  loss of bowel control? N  Managing your Medications? N  Managing your Finances? N  Housekeeping or managing your Housekeeping? N    Patient Care Team: Rollene Almarie LABOR, MD as PCP - General (Internal Medicine) Rollene Almarie LABOR, MD (Internal Medicine)  I have updated your Care Teams any recent Medical Services you may have received from other providers in the past year.     Assessment:   This is a routine wellness examination for Thomas Ayala.  Hearing/Vision screen Hearing Screening - Comments:: Denies hearing difficulties   Vision Screening - Comments:: Wears rx glasses - up to date with routine eye exams with [facility]   Goals Addressed               This Visit's Progress     Patient Stated (pt-stated)        Patient stated he plans to continue staying active       Depression Screen     02/27/2024   12:41 PM 02/25/2023    2:47 PM 11/12/2022    2:49 PM 12/14/2021    9:35 AM 09/11/2021   10:37 AM 07/30/2021    1:25 PM 10/13/2020    2:06 PM  PHQ 2/9 Scores  PHQ - 2 Score 0 0 0 0 0 2 0  PHQ- 9 Score 2 3 0 0 3 10 0    Fall Risk     02/27/2024   12:40 PM 02/25/2023    2:43 PM 11/12/2022    2:49 PM 07/30/2021    1:24 PM 10/13/2020    2:34 PM  Fall Risk   Falls in the past year? 0 0 0 0 0  Number  falls in past yr: 0 0 0 0 0  Injury with Fall? 0 0 0 0 0  Risk for fall due to : No Fall Risks No Fall Risks  No Fall Risks   Follow up Falls evaluation completed;Falls prevention discussed Falls prevention discussed;Falls evaluation completed Falls evaluation completed Falls evaluation completed  Falls evaluation completed      Data saved with a previous flowsheet row definition    MEDICARE RISK AT HOME:  Medicare Risk at Home Any stairs in or around the home?: No If so, are there any without handrails?: No Home free of loose throw rugs in walkways, pet beds, electrical cords, etc?: Yes Adequate lighting in your home to reduce risk of falls?: Yes Life alert?: No Use of a cane, walker or w/c?: No Grab bars in the bathroom?: No Shower chair or bench in shower?: No Elevated toilet seat or a handicapped toilet?: No  TIMED UP AND GO:  Was the test performed?  No  Cognitive Function: 6CIT completed        02/27/2024   12:44 PM 02/25/2023    3:04 PM  6CIT Screen  What Year? 0 points 0 points  What month? 0 points 0 points  What time? 0 points 0 points  Count back from 20 0 points 0 points  Months in reverse 0 points 0 points  Repeat phrase 0 points 0 points  Total Score 0 points 0 points    Immunizations Immunization History  Administered Date(s) Administered   Tdap 08/28/2012, 05/04/2014    Screening Tests Health Maintenance  Topic Date Due   COVID-19 Vaccine (1) Never done   Hepatitis C Screening  Never done   Zoster Vaccines- Shingrix (1 of 2) Never done   Mammogram  Never done   Pneumococcal Vaccine: 50+ Years (  1 of 1 - PCV) Never done   Influenza Vaccine  Never done   Colonoscopy  02/26/2025 (Originally 12/17/2000)   DTaP/Tdap/Td (3 - Td or Tdap) 05/04/2024   Medicare Annual Wellness (AWV)  02/26/2025   Fecal DNA (Cologuard)  02/22/2026   Meningococcal B Vaccine  Aged Out    Health Maintenance Items Addressed:  I have recommended that this patient have a  Influenza, Pneumonia, and Shingles but he declines at this time. I have discussed the risks and benefits of this procedure with him. The patient verbalizes understanding.   Additional Screening:  Vision Screening: Recommended annual ophthalmology exams for early detection of glaucoma and other disorders of the eye. Is the patient up to date with their annual eye exam?  Yes  Who is the provider or what is the name of the office in which the patient attends annual eye exams? [facility]  Dental Screening: Recommended annual dental exams for proper oral hygiene  Community Resource Referral / Chronic Care Management: CRR required this visit?  No   CCM required this visit?  No   Plan:    I have personally reviewed and noted the following in the patient's chart:   Medical and social history Use of alcohol, tobacco or illicit drugs  Current medications and supplements including opioid prescriptions. Patient is not currently taking opioid prescriptions. Functional ability and status Nutritional status Physical activity Advanced directives List of other physicians Hospitalizations, surgeries, and ER visits in previous 12 months Vitals Screenings to include cognitive, depression, and falls Referrals and appointments  In addition, I have reviewed and discussed with patient certain preventive protocols, quality metrics, and best practice recommendations. A written personalized care plan for preventive services as well as general preventive health recommendations were provided to patient.   Verdie CHRISTELLA Saba, CMA   02/27/2024   After Visit Summary: (In Person-Declined) Patient declined AVS at this time.  Notes: Scheduled a Physical w/PCP for 03/2024.

## 2024-02-27 NOTE — Patient Instructions (Addendum)
 Thomas Ayala,  Thank you for taking the time for your Medicare Wellness Visit. I appreciate your continued commitment to your health goals. Please review the care plan we discussed, and feel free to reach out if I can assist you further.  Medicare recommends these wellness visits once per year to help you and your care team stay ahead of potential health issues. These visits are designed to focus on prevention, allowing your provider to concentrate on managing your acute and chronic conditions during your regular appointments.  Please note that Annual Wellness Visits do not include a physical exam. Some assessments may be limited, especially if the visit was conducted virtually. If needed, we may recommend a separate in-person follow-up with your provider.  Ongoing Care Seeing your primary care provider every 3 to 6 months helps us  monitor your health and provide consistent, personalized care.   Referrals If a referral was made during today's visit and you haven't received any updates within two weeks, please contact the referred provider directly to check on the status.  Recommended Screenings:  Health Maintenance  Topic Date Due   COVID-19 Vaccine (1) Never done   Hepatitis C Screening  Never done   Zoster (Shingles) Vaccine (1 of 2) Never done   Breast Cancer Screening  Never done   Pneumococcal Vaccine for age over 36 (1 of 1 - PCV) Never done   Flu Shot  Never done   Colon Cancer Screening  02/26/2025*   DTaP/Tdap/Td vaccine (3 - Td or Tdap) 05/04/2024   Medicare Annual Wellness Visit  02/26/2025   Cologuard (Stool DNA test)  02/22/2026   Meningitis B Vaccine  Aged Out  *Topic was postponed. The date shown is not the original due date.       02/27/2024   12:38 PM  Advanced Directives  Does Patient Have a Medical Advance Directive? No  Would patient like information on creating a medical advance directive? No - Patient declined   Advance Care Planning is important because  it: Ensures you receive medical care that aligns with your values, goals, and preferences. Provides guidance to your family and loved ones, reducing the emotional burden of decision-making during critical moments.  Vision: Annual vision screenings are recommended for early detection of glaucoma, cataracts, and diabetic retinopathy. These exams can also reveal signs of chronic conditions such as diabetes and high blood pressure.  Dental: Annual dental screenings help detect early signs of oral cancer, gum disease, and other conditions linked to overall health, including heart disease and diabetes.

## 2024-03-18 ENCOUNTER — Ambulatory Visit: Admission: EM | Admit: 2024-03-18 | Discharge: 2024-03-18 | Disposition: A | Discharge status: Home / Self Care

## 2024-03-18 DIAGNOSIS — M7651 Patellar tendinitis, right knee: Secondary | ICD-10-CM

## 2024-03-18 DIAGNOSIS — M7652 Patellar tendinitis, left knee: Secondary | ICD-10-CM

## 2024-03-18 MED ORDER — INDOMETHACIN 50 MG PO CAPS: 50.0000 mg | ORAL_CAPSULE | Freq: Two times a day (BID) | ORAL | 2 refills | Status: AC

## 2024-03-18 NOTE — ED Provider Notes (Signed)
 [facility]  Note:  This document was prepared using Conservation officer, historic buildings and may include unintentional dictation errors.  MRN: [id number] DOB: 25-Jan-1956  Subjective:   Thomas Ayala is a 68 y.o. male presenting for evaluation of soreness to bilateral knees but worse on the right.  Patient reports past history of gout with no recent flareup.  Patient denies taking any prophylactic medication to prevent gout flares.  Patient reports he has been exercising recently and notices the pain is over the tibial tuberosity lower insertion point of the patellar tendon.  Patient denies any recent trauma or injury.  Patient reports that he has been prescribed indomethacin  in the past that seemed to be very helpful and relieving his pain.  No current facility-administered medications for this encounter.  Current Outpatient Medications:    indomethacin  (INDOCIN ) 50 MG capsule, Take 1 capsule (50 mg total) by mouth 2 (two) times daily with a meal., Disp: 30 capsule, Rfl: 2   hydrochlorothiazide  (HYDRODIURIL ) 25 MG tablet, Take 25 mg by mouth daily., Disp: , Rfl:    losartan -hydrochlorothiazide  (HYZAAR) 100-25 MG tablet, TAKE 1 TABLET BY MOUTH DAILY, Disp: 90 tablet, Rfl: 0   Melatonin 10 MG TABS, Take 10 mg by mouth at bedtime. As needed for sleep, Disp: , Rfl:    oxymetazoline  (AFRIN NASAL SPRAY) 0.05 % nasal spray, Place 1 spray into both nostrils 2 (two) times daily., Disp: 30 mL, Rfl: 0   sildenafil  (REVATIO ) 20 MG tablet, Take 20 mg by mouth as directed., Disp: , Rfl:    sildenafil  (VIAGRA ) 100 MG tablet, Take 1 tablet (100 mg total) by mouth daily as needed for erectile dysfunction., Disp: 30 tablet, Rfl: 11   tadalafil  (CIALIS ) 20 MG tablet, Take 0.5-1 tablets (10-20 mg total) by mouth every other day as needed for erectile dysfunction., Disp: 30 tablet, Rfl: 11   tamsulosin  (FLOMAX ) 0.4 MG CAPS capsule, Take 1 capsule (0.4 mg total) by mouth daily., Disp: 30 capsule, Rfl: 0    TURMERIC PO, See admin instructions., Disp: , Rfl:    Vitamin D , Ergocalciferol , (DRISDOL) 1.25 MG (50000 UNIT) CAPS capsule, Take 1 capsule by mouth once a week., Disp: , Rfl:    Allergies  Allergen Reactions   Benazepril  Hcl Swelling    Past Medical History:  Diagnosis Date   Arthritis    Cancer [facility])    ED (erectile dysfunction)    Gout    Gout    Hypertension    Prostate cancer (HCC)      History reviewed. No pertinent surgical history.  Family History  Problem Relation Age of Onset   Dementia Mother    Lung cancer Father    Prostate cancer Brother     Social History   Tobacco Use   Smoking status: Never   Smokeless tobacco: Never  Vaping Use   Vaping status: Never Used  Substance Use Topics   Alcohol use: Yes    Alcohol/week: 1.0 standard drink of alcohol    Types: 1 Cans of beer per week    Comment: occasionally   Drug use: Not Currently    ROS Refer to HPI for ROS details.  Objective:   Vitals: BP 128/85 (BP Location: Left Arm)   Pulse 88   Temp (!) 97.4 F (36.3 C) (Oral)   Resp 18   Ht 5' 5 (1.651 m)   Wt 214 lb 4.6 oz (97.2 kg)   SpO2 96%   BMI 35.66 kg/m   Physical Exam  Vitals and nursing note reviewed.  Constitutional:      General: He is not in acute distress.    Appearance: Normal appearance. He is not ill-appearing or toxic-appearing.  HENT:     Head: Normocephalic.  Cardiovascular:     Rate and Rhythm: Normal rate.  Pulmonary:     Effort: Pulmonary effort is normal. No respiratory distress.  Musculoskeletal:     Right knee: Bony tenderness and crepitus present. No swelling, deformity or erythema. Normal range of motion. Tenderness present. Normal patellar mobility. Normal pulse.     Left knee: Bony tenderness and crepitus present. No swelling, deformity or erythema. Normal range of motion. Tenderness present. Normal patellar mobility. Normal pulse.       Legs:  Skin:    General: Skin is warm and dry.     Capillary Refill:  Capillary refill takes less than 2 seconds.  Neurological:     General: No focal deficit present.     Mental Status: He is alert and oriented to person, place, and time.  Psychiatric:        Mood and Affect: Mood normal.        Behavior: Behavior normal.     Procedures  No results found for this or any previous visit (from the past 24 hours).  No results found.   Assessment and Plan :     Discharge Instructions       1. Patellar tendinitis of both knees (Primary) - indomethacin  (INDOCIN ) 50 MG capsule; Take 1 capsule (50 mg total) by mouth 2 (two) times daily with a meal.  Dispense: 30 capsule; Refill: 2 - AMB referral to orthopedics for follow-up evaluation and management if symptoms do not improve with current therapy. -Continue to monitor symptoms for any change in severity if there is any escalation of current symptoms or development of new symptoms follow-up in ER for further evaluation and management.       Clotilde Loth B Pelagia Iacobucci   Maanav Kassabian, [place], [place] 03/18/24 [phone]

## 2024-03-18 NOTE — ED Triage Notes (Addendum)
 Patient reports having a sore knee (especially right) but both are sore. States history of gout flare ups. No daily prophylaxis for Gout currently. No injury.

## 2024-03-18 NOTE — Discharge Instructions (Addendum)
  1. Patellar tendinitis of both knees (Primary) - indomethacin  (INDOCIN ) 50 MG capsule; Take 1 capsule (50 mg total) by mouth 2 (two) times daily with a meal.  Dispense: 30 capsule; Refill: 2 - AMB referral to orthopedics for follow-up evaluation and management if symptoms do not improve with current therapy. -Continue to monitor symptoms for any change in severity if there is any escalation of current symptoms or development of new symptoms follow-up in ER for further evaluation and management.

## 2024-03-27 ENCOUNTER — Encounter: Payer: Self-pay | Admitting: Internal Medicine

## 2024-03-27 ENCOUNTER — Ambulatory Visit (INDEPENDENT_AMBULATORY_CARE_PROVIDER_SITE_OTHER): Admitting: Internal Medicine

## 2024-03-27 VITALS — BP 132/80 | HR 67 | Temp 98.9°F | Ht 65.0 in | Wt 215.6 lb

## 2024-03-27 DIAGNOSIS — I10 Essential (primary) hypertension: Secondary | ICD-10-CM

## 2024-03-27 DIAGNOSIS — C61 Malignant neoplasm of prostate: Secondary | ICD-10-CM

## 2024-03-27 DIAGNOSIS — N529 Male erectile dysfunction, unspecified: Secondary | ICD-10-CM | POA: Diagnosis not present

## 2024-03-27 DIAGNOSIS — Z0000 Encounter for general adult medical examination without abnormal findings: Secondary | ICD-10-CM

## 2024-03-27 DIAGNOSIS — Z6835 Body mass index (BMI) 35.0-35.9, adult: Secondary | ICD-10-CM

## 2024-03-27 DIAGNOSIS — M1A9XX0 Chronic gout, unspecified, without tophus (tophi): Secondary | ICD-10-CM

## 2024-03-27 DIAGNOSIS — E559 Vitamin D deficiency, unspecified: Secondary | ICD-10-CM

## 2024-03-27 LAB — LIPID PANEL
Cholesterol: 163 mg/dL (ref 0–200)
HDL: 39.7 mg/dL (ref >39.00)
LDL Cholesterol: 110 mg/dL — ABNORMAL HIGH (ref 0–99)
NonHDL: 123.11
Total CHOL/HDL Ratio: 4
Triglycerides: 68 mg/dL (ref 0.0–149.0)
VLDL: 13.6 mg/dL (ref 0.0–40.0)

## 2024-03-27 LAB — COMPREHENSIVE METABOLIC PANEL WITH GFR
ALT: 15 U/L (ref 0–53)
AST: 20 U/L (ref 0–37)
Albumin: 4.4 g/dL (ref 3.5–5.2)
Alkaline Phosphatase: 46 U/L (ref 39–117)
BUN: 14 mg/dL (ref 6–23)
CO2: 27 meq/L (ref 19–32)
Calcium: 9.3 mg/dL (ref 8.4–10.5)
Chloride: 103 meq/L (ref 96–112)
Creatinine, Ser: 1.03 mg/dL (ref 0.40–1.50)
GFR: 74.76 mL/min (ref >60.00)
Glucose, Bld: 71 mg/dL (ref 70–99)
Potassium: 4 meq/L (ref 3.5–5.1)
Sodium: 140 meq/L (ref 135–145)
Total Bilirubin: 0.5 mg/dL (ref 0.2–1.2)
Total Protein: 7.2 g/dL (ref 6.0–8.3)

## 2024-03-27 LAB — CBC
HCT: 38.3 % — ABNORMAL LOW (ref 39.0–52.0)
Hemoglobin: 12.8 g/dL — ABNORMAL LOW (ref 13.0–17.0)
MCHC: 33.4 g/dL (ref 30.0–36.0)
MCV: 84.8 fl (ref 78.0–100.0)
Platelets: 214 K/uL (ref 150.0–400.0)
RBC: 4.52 Mil/uL (ref 4.22–5.81)
RDW: 14.5 % (ref 11.5–15.5)
WBC: 8.3 K/uL (ref 4.0–10.5)

## 2024-03-27 LAB — URIC ACID: Uric Acid, Serum: 7.6 mg/dL (ref 4.0–7.8)

## 2024-03-27 MED ORDER — HYDROCHLOROTHIAZIDE 25 MG PO TABS: 25.0000 mg | ORAL_TABLET | Freq: Every day | ORAL | 3 refills | Status: AC

## 2024-03-27 MED ORDER — TADALAFIL 20 MG PO TABS: 10.0000 mg | ORAL_TABLET | ORAL | 11 refills | Status: DC | PRN

## 2024-03-27 MED ORDER — LOSARTAN POTASSIUM 100 MG PO TABS: 100.0000 mg | ORAL_TABLET | Freq: Every day | ORAL | 3 refills | Status: AC

## 2024-03-27 MED ORDER — SILDENAFIL CITRATE 100 MG PO TABS: 100.0000 mg | ORAL_TABLET | Freq: Every day | ORAL | 11 refills | Status: AC | PRN

## 2024-03-27 NOTE — Patient Instructions (Signed)
 Think about getting the prostate cancer treated depending on the PSA results.  Let us  know if you start having any new back or bone related pain.

## 2024-03-27 NOTE — Progress Notes (Unsigned)
   Subjective:   Patient ID: Thomas Ayala, male    DOB: [birth year], 68 y.o.   MRN: [id number]  The patient is here for physical. Pertinent topics discussed: Discussed the use of AI scribe software for clinical note transcription with the patient, who gave verbal consent to proceed.  History of Present Illness Thomas Ayala is a 68 year old male with prostate cancer who presents for an annual physical exam and lab work.  He experiences right-sided groin pain when sleeping on his side, which subsides with movement. He has a history of a pulled groin and is concerned about a possible hernia, although he does not notice any bulging or specific pain with lifting or coughing. He engages in regular physical activity, including lifting weights, and is mindful of not overexerting himself.  He has a history of prostate cancer with a PSA level that increased last year. He underwent carpal tunnel and elbow surgery earlier this year. He has had pet scan and met with rad/onc but has not proceeded with treatment of his cancer. He is awaiting current PSA results. His family history includes his oldest brother who died of prostate cancer at age 71 and his father who passed away at 21, contributing to his concern about his own health.  PMH, [facility], social history reviewed and updated  Review of Systems  Constitutional: Negative.   HENT: Negative.    Eyes: Negative.   Respiratory:  Negative for cough, chest tightness and shortness of breath.   Cardiovascular:  Negative for chest pain, palpitations and leg swelling.  Gastrointestinal:  Negative for abdominal distention, abdominal pain, constipation, diarrhea, nausea and vomiting.  Musculoskeletal: Negative.   Skin: Negative.   Neurological: Negative.   Psychiatric/Behavioral: Negative.      Objective:  Physical Exam Constitutional:      Appearance: He is well-developed.  HENT:     Head: Normocephalic and atraumatic.  Cardiovascular:     Rate and  Rhythm: Normal rate and regular rhythm.  Pulmonary:     Effort: Pulmonary effort is normal. No respiratory distress.     Breath sounds: Normal breath sounds. No wheezing or rales.  Abdominal:     General: Bowel sounds are normal. There is no distension.     Palpations: Abdomen is soft.     Tenderness: There is no abdominal tenderness.  Musculoskeletal:     Cervical back: Normal range of motion.  Skin:    General: Skin is warm and dry.  Neurological:     Mental Status: He is alert and oriented to person, place, and time.     Coordination: Coordination normal.     Vitals:   03/27/24 1452  BP: 132/80  Pulse: 67  Temp: 98.9 F (37.2 C)  TempSrc: Oral  SpO2: 95%  Weight: 215 lb 9.6 oz (97.8 kg)  Height: 5' 5 (1.651 m)    Assessment & Plan:

## 2024-03-27 NOTE — Assessment & Plan Note (Signed)
 Checking PSA, he has met with rad onc again since last visit last year and still has not elected to proceed with recommended treatment.

## 2024-03-28 LAB — VITAMIN D 25 HYDROXY (VIT D DEFICIENCY, FRACTURES): VITD: 62.47 ng/mL (ref 30.00–100.00)

## 2024-03-28 LAB — PSA: PSA: 35.12 ng/mL — ABNORMAL HIGH (ref 0.10–4.00)

## 2024-03-28 NOTE — Assessment & Plan Note (Signed)
 Checking uric acid and adjust as needed.

## 2024-03-28 NOTE — Assessment & Plan Note (Signed)
 Refill sildenafil  and tadalafil  which he uses prn for ED.

## 2024-03-28 NOTE — Assessment & Plan Note (Signed)
Flu shot declines. Pneumonia declines. Shingrix declines. Tetanus declines. Cologuard up to date. Counseled about sun safety and mole surveillance. Counseled about the dangers of distracted driving. Given 10 year screening recommendations.

## 2024-03-28 NOTE — Assessment & Plan Note (Signed)
 Checking vitamin D  and adjust as needed.

## 2024-03-28 NOTE — Assessment & Plan Note (Signed)
 BP at goal on losartan /hydrochlorothiazide  and he wishes separate pills which is done today. Checking CBC and CMP and lipid panel.

## 2024-03-28 NOTE — Assessment & Plan Note (Signed)
 BMI 35.8 but complicated by hypertension. Counseled about diet and lifestyle changes.

## 2024-03-29 ENCOUNTER — Ambulatory Visit: Payer: Self-pay | Admitting: Internal Medicine

## 2024-04-17 ENCOUNTER — Other Ambulatory Visit: Payer: Self-pay | Admitting: Internal Medicine

## 2024-04-30 ENCOUNTER — Other Ambulatory Visit (HOSPITAL_COMMUNITY): Payer: Self-pay

## 2024-05-03 DIAGNOSIS — C61 Malignant neoplasm of prostate: Secondary | ICD-10-CM | POA: Diagnosis not present

## 2024-06-14 ENCOUNTER — Other Ambulatory Visit: Payer: Self-pay | Admitting: Internal Medicine

## 2024-06-14 DIAGNOSIS — N529 Male erectile dysfunction, unspecified: Secondary | ICD-10-CM

## 2025-03-29 ENCOUNTER — Encounter: Admitting: Internal Medicine

## 2025-03-29 ENCOUNTER — Ambulatory Visit
